# Patient Record
Sex: Female | Born: 1970 | Race: Black or African American | Hispanic: No | Marital: Single | Smoking: Current every day smoker
Health system: Southern US, Community
[De-identification: ages and names within clinical notes are randomized; demographics above are authoritative.]

## PROBLEM LIST (undated history)

## (undated) DIAGNOSIS — L509 Urticaria, unspecified: Secondary | ICD-10-CM

## (undated) DIAGNOSIS — E039 Hypothyroidism, unspecified: Secondary | ICD-10-CM

## (undated) DIAGNOSIS — D649 Anemia, unspecified: Secondary | ICD-10-CM

## (undated) DIAGNOSIS — F17200 Nicotine dependence, unspecified, uncomplicated: Secondary | ICD-10-CM

## (undated) DIAGNOSIS — A64 Unspecified sexually transmitted disease: Secondary | ICD-10-CM

## (undated) DIAGNOSIS — R87619 Unspecified abnormal cytological findings in specimens from cervix uteri: Secondary | ICD-10-CM

## (undated) HISTORY — DX: Anemia, unspecified: D64.9

## (undated) HISTORY — DX: Urticaria, unspecified: L50.9

## (undated) HISTORY — DX: Unspecified abnormal cytological findings in specimens from cervix uteri: R87.619

## (undated) HISTORY — DX: Hypothyroidism, unspecified: E03.9

## (undated) HISTORY — PX: COLPOSCOPY: SHX161

## (undated) HISTORY — DX: Nicotine dependence, unspecified, uncomplicated: F17.200

## (undated) HISTORY — DX: Unspecified sexually transmitted disease: A64

## (undated) HISTORY — PX: TUBAL LIGATION: SHX77

## (undated) HISTORY — PX: BREAST BIOPSY: SHX20

---

## 2020-02-22 ENCOUNTER — Encounter (HOSPITAL_COMMUNITY): Payer: Self-pay

## 2020-02-22 ENCOUNTER — Other Ambulatory Visit: Payer: Self-pay

## 2020-02-22 ENCOUNTER — Emergency Department (HOSPITAL_COMMUNITY): Payer: Commercial Managed Care - PPO

## 2020-02-22 DIAGNOSIS — R079 Chest pain, unspecified: Secondary | ICD-10-CM | POA: Diagnosis not present

## 2020-02-22 DIAGNOSIS — M25519 Pain in unspecified shoulder: Secondary | ICD-10-CM | POA: Insufficient documentation

## 2020-02-22 DIAGNOSIS — M542 Cervicalgia: Secondary | ICD-10-CM | POA: Diagnosis not present

## 2020-02-22 DIAGNOSIS — Z5321 Procedure and treatment not carried out due to patient leaving prior to being seen by health care provider: Secondary | ICD-10-CM | POA: Insufficient documentation

## 2020-02-22 NOTE — ED Notes (Signed)
Pt ambulatory to RR w/out assistance. 

## 2020-02-22 NOTE — ED Triage Notes (Signed)
She c/o sudden onset of pain at right upper chest/shoulder area about an hour p.t.a. whilst shopping. Her skin is normal, warm and dry and she is breathing normally. Screening EKG performed at triage.

## 2020-02-23 ENCOUNTER — Emergency Department (HOSPITAL_COMMUNITY)
Admission: EM | Admit: 2020-02-23 | Discharge: 2020-02-23 | Disposition: A | Discharge status: Home / Self Care | Payer: Commercial Managed Care - PPO | Attending: Emergency Medicine | Admitting: Emergency Medicine

## 2020-02-23 ENCOUNTER — Encounter (HOSPITAL_COMMUNITY): Payer: Self-pay

## 2020-02-23 ENCOUNTER — Other Ambulatory Visit: Payer: Self-pay

## 2020-02-23 ENCOUNTER — Emergency Department (HOSPITAL_COMMUNITY): Payer: Commercial Managed Care - PPO

## 2020-02-23 ENCOUNTER — Emergency Department (HOSPITAL_COMMUNITY)
Admission: EM | Admit: 2020-02-23 | Discharge: 2020-02-23 | Disposition: A | Discharge status: Home / Self Care | Payer: Commercial Managed Care - PPO | Source: Home / Self Care | Attending: Emergency Medicine | Admitting: Emergency Medicine

## 2020-02-23 DIAGNOSIS — R079 Chest pain, unspecified: Secondary | ICD-10-CM

## 2020-02-23 DIAGNOSIS — R0789 Other chest pain: Secondary | ICD-10-CM | POA: Insufficient documentation

## 2020-02-23 DIAGNOSIS — F17200 Nicotine dependence, unspecified, uncomplicated: Secondary | ICD-10-CM | POA: Insufficient documentation

## 2020-02-23 DIAGNOSIS — R0602 Shortness of breath: Secondary | ICD-10-CM | POA: Insufficient documentation

## 2020-02-23 DIAGNOSIS — M542 Cervicalgia: Secondary | ICD-10-CM | POA: Insufficient documentation

## 2020-02-23 LAB — BASIC METABOLIC PANEL
Anion gap: 9 (ref 5–15)
BUN: 9 mg/dL (ref 6–20)
CO2: 29 mmol/L (ref 22–32)
Calcium: 8.6 mg/dL — ABNORMAL LOW (ref 8.9–10.3)
Chloride: 101 mmol/L (ref 98–111)
Creatinine, Ser: 0.78 mg/dL (ref 0.44–1.00)
GFR calc Af Amer: >60 mL/min (ref >60)
GFR calc non Af Amer: >60 mL/min (ref >60)
Glucose, Bld: 81 mg/dL (ref 70–99)
Potassium: 3.4 mmol/L — ABNORMAL LOW (ref 3.5–5.1)
Sodium: 139 mmol/L (ref 135–145)

## 2020-02-23 LAB — CBC
HCT: 42.8 % (ref 36.0–46.0)
Hemoglobin: 13.8 g/dL (ref 12.0–15.0)
MCH: 28.3 pg (ref 26.0–34.0)
MCHC: 32.2 g/dL (ref 30.0–36.0)
MCV: 87.9 fL (ref 80.0–100.0)
Platelets: 255 10*3/uL (ref 150–400)
RBC: 4.87 MIL/uL (ref 3.87–5.11)
RDW: 14.3 % (ref 11.5–15.5)
WBC: 5.5 10*3/uL (ref 4.0–10.5)
nRBC: 0 % (ref 0.0–0.2)

## 2020-02-23 LAB — TROPONIN I (HIGH SENSITIVITY)
Troponin I (High Sensitivity): 5 ng/L (ref <18)
Troponin I (High Sensitivity): 5 ng/L (ref <18)

## 2020-02-23 LAB — D-DIMER, QUANTITATIVE: D-Dimer, Quant: <0.27 ug/mL-FEU (ref 0.00–0.50)

## 2020-02-23 MED ORDER — KETOROLAC TROMETHAMINE 30 MG/ML IJ SOLN
30.0000 mg | Freq: Once | INTRAMUSCULAR | Status: AC
  Administered 2020-02-23: 30 mg via INTRAVENOUS
  Filled 2020-02-23: qty 1

## 2020-02-23 MED ORDER — SODIUM CHLORIDE 0.9% FLUSH
3.0000 mL | Freq: Once | INTRAVENOUS | Status: AC
  Administered 2020-02-23: 3 mL via INTRAVENOUS

## 2020-02-23 NOTE — ED Notes (Signed)
Final call for pt with no response.  °

## 2020-02-23 NOTE — ED Provider Notes (Signed)
[facility]-EMERGENCY DEPT Provider Note   CSN: [id number] Arrival date & time: 02/23/20  1049     History Chief Complaint  Patient presents with  . Chest Pain    Lemon Whitacre is a 49 y.o. female.  She has no significant past medical history.  She is a smoker.  Complaining of left-sided chest and neck pain that started yesterday at rest.  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  Worse with bending twisting turning taking a deep breath.  Says she feels short of breath at times.  No prior history of cardiac disease.  She said she woke up drenched in sweat this morning.  No dizziness lightheadedness.  Denies any cocaine.  The history is provided by the patient.  Chest Pain Pain location:  L chest Pain quality: stabbing   Pain radiates to:  Neck Pain severity:  Moderate Onset quality:  Gradual Duration:  2 days Timing:  Constant Progression:  Unchanged Chronicity:  New Context: at rest   Relieved by:  None tried Worsened by:  Certain positions, deep breathing and movement Ineffective treatments:  None tried Associated symptoms: diaphoresis and shortness of breath   Associated symptoms: no abdominal pain, no altered mental status, no back pain, no cough, no dysphagia, no fever, no headache, no lower extremity edema, no nausea, no numbness, no vomiting and no weakness   Risk factors: smoking     HPI: A 49 year old patient presents for evaluation of chest pain. Initial onset of pain was more than 6 hours ago. The patient's chest pain is sharp and is not worse with exertion. The patient reports some diaphoresis. The patient's chest pain is middle- or left-sided, is not well-localized, is not described as heaviness/pressure/tightness and does radiate to the arms/jaw/neck. The patient does not complain of nausea. The patient has smoked in the past 90 days. The patient has no history of stroke, has no history of peripheral artery disease, denies  any history of treated diabetes, has no relevant family history of coronary artery disease (first degree relative at less than age 74), is not hypertensive, has no history of hypercholesterolemia and does not have an elevated BMI (>=30).   History reviewed. No pertinent past medical history.  There are no problems to display for this patient.   History reviewed. No pertinent surgical history.   OB History   No obstetric history on file.     History reviewed. No pertinent family history.  Social History   Tobacco Use  . Smoking status: Current Every Day Smoker  . Smokeless tobacco: Never Used  Substance Use Topics  . Alcohol use: Yes  . Drug use: Not on file    Home Medications Prior to Admission medications   Not on File    Allergies    Patient has no known allergies.  Review of Systems   Review of Systems  Constitutional: Positive for diaphoresis. Negative for fever.  HENT: Negative for sore throat and trouble swallowing.   Eyes: Negative for visual disturbance.  Respiratory: Positive for shortness of breath. Negative for cough.   Cardiovascular: Positive for chest pain.  Gastrointestinal: Negative for abdominal pain, nausea and vomiting.  Genitourinary: Negative for dysuria.  Musculoskeletal: Positive for neck pain. Negative for back pain.  Skin: Negative for rash.  Neurological: Negative for weakness, numbness and headaches.    Physical Exam Updated Vital Signs BP (!) 130/83   Pulse 80   Temp (!) 97.5 F (36.4 C) (  Oral)   Resp 21   Ht 5\' 6"  (1.676 m)   Wt 59 kg   LMP  (LMP Unknown)   SpO2 100%   BMI 20.98 kg/m   Physical Exam Vitals and nursing note reviewed.  Constitutional:      General: She is not in acute distress.    Appearance: She is well-developed.  HENT:     Head: Normocephalic and atraumatic.  Eyes:     Conjunctiva/sclera: Conjunctivae normal.  Cardiovascular:     Rate and Rhythm: Normal rate and regular rhythm.     Heart sounds:  Normal heart sounds. No murmur heard.   Pulmonary:     Effort: Pulmonary effort is normal. No respiratory distress.     Breath sounds: Normal breath sounds.  Abdominal:     Palpations: Abdomen is soft.     Tenderness: There is no abdominal tenderness.  Musculoskeletal:        General: Normal range of motion.     Cervical back: Neck supple.     Right lower leg: No tenderness.     Left lower leg: No tenderness.  Skin:    General: Skin is warm and dry.     Capillary Refill: Capillary refill takes less than 2 seconds.  Neurological:     General: No focal deficit present.     Mental Status: She is alert.     ED Results / Procedures / Treatments   Labs (all labs ordered are listed, but only abnormal results are displayed) Labs Reviewed  BASIC METABOLIC PANEL - Abnormal; Notable for the following components:      Result Value   Potassium 3.4 (*)    Calcium 8.6 (*)    All other components within normal limits  CBC  D-DIMER, QUANTITATIVE (NOT AT [facility])  TROPONIN I (HIGH SENSITIVITY)  TROPONIN I (HIGH SENSITIVITY)    EKG EKG Interpretation  Date/Time:  Sunday February 23 2020 11:07:38 EDT Ventricular Rate:  84 PR Interval:    QRS Duration: 79 QT Interval:  384 QTC Calculation: 454 R Axis:   62 Text Interpretation: Sinus rhythm Low voltage, precordial leads Baseline wander in lead(s) II aVF 12 Lead; Mason-Likar since last tracing no significant change Confirmed by [id number] [phone]) on 02/23/2020 11:26:26 AM   Radiology DG Chest 2 View  Result Date: 02/23/2020 CLINICAL DATA:  Left upper chest pain with inspiration since yesterday. EXAM: CHEST - 2 VIEW COMPARISON:  02/22/2020 FINDINGS: The heart size and mediastinal contours are within normal limits. Both lungs are clear. The visualized skeletal structures are unremarkable. IMPRESSION: No active cardiopulmonary disease. Electronically Signed   By: 02/24/2020 M.D.   On: 02/23/2020 11:39   DG Chest 2 View  Result Date:  02/22/2020 CLINICAL DATA:  Sudden onset right upper chest pain, right shoulder pain EXAM: CHEST - 2 VIEW COMPARISON:  None. FINDINGS: Frontal and lateral views of the chest demonstrate an unremarkable cardiac silhouette. No airspace disease, effusion, or pneumothorax. No acute bony abnormalities. IMPRESSION: 1. No acute intrathoracic process. Electronically Signed   By: 02/24/2020 M.D.   On: 02/22/2020 20:21    Procedures Procedures (including critical care time)  Medications Ordered in ED Medications  sodium chloride flush (NS) 0.9 % injection 3 mL (has no administration in time range)  ketorolac (TORADOL) 30 MG/ML injection 30 mg (has no administration in time range)    ED Course  I have reviewed the triage vital signs and the nursing notes.  Pertinent labs & imaging  results that were available during my care of the patient were reviewed by me and considered in my medical decision making (see chart for details).  Clinical Course as of Feb 24 1119  Sun Feb 23, 2020  1415 Patient is not PERC negative due to her age but she is Wells negative.   [MB]  1559 Patient's delta troponin unchanged a D-dimer negative.   [MB]    Clinical Course User Index [MB] Terrilee Files, MD   MDM Rules/Calculators/A&P HEAR Score: 4                       This patient complains of sharp left-sided chest and neck pain; this involves an extensive number of treatment Options and is a complaint that carries with it a high risk of complications and Morbidity. The differential includes ACS, PE, musculoskeletal, GERD, pneumothorax, vascular, pleurisy  I ordered, reviewed and interpreted labs, which included CBC with normal white count normal hemoglobin, chemistries normal other than a mildly low potassium of 3.4 and calcium of 8.6, delta troponin negative, D-dimer negative I ordered medication Toradol with minimal change in patient's symptoms I ordered imaging studies which included chest x-ray and I  independently    visualized and interpreted imaging which showed no pneumothorax no infiltrates Previous records obtained and reviewed in epic, no recent visits for same  After the interventions stated above, I reevaluated the patient and found patient remains hemodynamically stable satting 100% on room air.  Discussed with her possible causes of her symptoms and recommended symptomatic treatment such as Tylenol ice heat to affected area.  Return instructions discussed   Final Clinical Impression(s) / ED Diagnoses Final diagnoses:  Nonspecific chest pain    Rx / DC Orders ED Discharge Orders    None       Terrilee Files, MD 02/24/20 1122

## 2020-02-23 NOTE — Discharge Instructions (Signed)
You were seen in the emergency department for left-sided chest and neck pain.  You had blood work EKG and a chest x-ray that did not show any serious findings.  The symptoms may be muscular.  You can try ibuprofen 3 times a day with some food in your stomach.  Try some ice or heat to the affected area.  Follow-up with your doctor and return to the emergency department if any worsening or concerning symptoms.

## 2020-02-23 NOTE — ED Triage Notes (Signed)
No answer from pt when called x 1.  

## 2020-02-23 NOTE — ED Triage Notes (Signed)
Pt presents with c/o chest pain that started yesterday. Pt was here last night for same and left because of the wait time. Pt reports the pain is in her upper left chest. Pt denies any associated shortness of breath.

## 2020-02-23 NOTE — ED Notes (Signed)
No answer from pt when called for room x 2.  

## 2020-10-14 ENCOUNTER — Other Ambulatory Visit: Payer: Self-pay

## 2020-10-14 ENCOUNTER — Emergency Department (HOSPITAL_COMMUNITY)
Admission: EM | Admit: 2020-10-14 | Discharge: 2020-10-14 | Disposition: A | Discharge status: Home / Self Care | Payer: Commercial Managed Care - PPO | Attending: Emergency Medicine | Admitting: Emergency Medicine

## 2020-10-14 DIAGNOSIS — I951 Orthostatic hypotension: Secondary | ICD-10-CM | POA: Diagnosis not present

## 2020-10-14 DIAGNOSIS — F17200 Nicotine dependence, unspecified, uncomplicated: Secondary | ICD-10-CM | POA: Diagnosis not present

## 2020-10-14 DIAGNOSIS — E039 Hypothyroidism, unspecified: Secondary | ICD-10-CM | POA: Insufficient documentation

## 2020-10-14 DIAGNOSIS — R42 Dizziness and giddiness: Secondary | ICD-10-CM | POA: Diagnosis present

## 2020-10-14 LAB — URINALYSIS, ROUTINE W REFLEX MICROSCOPIC
Bilirubin Urine: NEGATIVE
Glucose, UA: NEGATIVE mg/dL
Ketones, ur: NEGATIVE mg/dL
Leukocytes,Ua: NEGATIVE
Nitrite: NEGATIVE
Protein, ur: NEGATIVE mg/dL
Specific Gravity, Urine: 1.003 — ABNORMAL LOW (ref 1.005–1.030)
pH: 7 (ref 5.0–8.0)

## 2020-10-14 LAB — COMPREHENSIVE METABOLIC PANEL
ALT: 12 U/L (ref 0–44)
AST: 16 U/L (ref 15–41)
Albumin: 3.8 g/dL (ref 3.5–5.0)
Alkaline Phosphatase: 75 U/L (ref 38–126)
Anion gap: 7 (ref 5–15)
BUN: 11 mg/dL (ref 6–20)
CO2: 27 mmol/L (ref 22–32)
Calcium: 8.6 mg/dL — ABNORMAL LOW (ref 8.9–10.3)
Chloride: 104 mmol/L (ref 98–111)
Creatinine, Ser: 0.8 mg/dL (ref 0.44–1.00)
GFR, Estimated: >60 mL/min (ref >60)
Glucose, Bld: 85 mg/dL (ref 70–99)
Potassium: 3.4 mmol/L — ABNORMAL LOW (ref 3.5–5.1)
Sodium: 138 mmol/L (ref 135–145)
Total Bilirubin: 0.6 mg/dL (ref 0.3–1.2)
Total Protein: 6.9 g/dL (ref 6.5–8.1)

## 2020-10-14 LAB — CBC WITH DIFFERENTIAL/PLATELET
Abs Immature Granulocytes: 0.03 10*3/uL (ref 0.00–0.07)
Basophils Absolute: 0 10*3/uL (ref 0.0–0.1)
Basophils Relative: 1 %
Eosinophils Absolute: 0.1 10*3/uL (ref 0.0–0.5)
Eosinophils Relative: 2 %
HCT: 37.6 % (ref 36.0–46.0)
Hemoglobin: 12 g/dL (ref 12.0–15.0)
Immature Granulocytes: 1 %
Lymphocytes Relative: 33 %
Lymphs Abs: 2.1 10*3/uL (ref 0.7–4.0)
MCH: 28.4 pg (ref 26.0–34.0)
MCHC: 31.9 g/dL (ref 30.0–36.0)
MCV: 88.9 fL (ref 80.0–100.0)
Monocytes Absolute: 0.5 10*3/uL (ref 0.1–1.0)
Monocytes Relative: 8 %
Neutro Abs: 3.6 10*3/uL (ref 1.7–7.7)
Neutrophils Relative %: 55 %
Platelets: 234 10*3/uL (ref 150–400)
RBC: 4.23 MIL/uL (ref 3.87–5.11)
RDW: 14.7 % (ref 11.5–15.5)
WBC: 6.4 10*3/uL (ref 4.0–10.5)
nRBC: 0 % (ref 0.0–0.2)

## 2020-10-14 LAB — TSH: TSH: 105.354 u[IU]/mL — ABNORMAL HIGH (ref 0.350–4.500)

## 2020-10-14 MED ORDER — LEVOTHYROXINE SODIUM 50 MCG PO TABS
50.0000 ug | ORAL_TABLET | Freq: Every day | ORAL | 0 refills | Status: DC
Start: 2020-10-14 — End: 2020-11-11

## 2020-10-14 MED ORDER — ACETAMINOPHEN 500 MG PO TABS
1000.0000 mg | ORAL_TABLET | Freq: Once | ORAL | Status: AC
  Administered 2020-10-14: 1000 mg via ORAL
  Filled 2020-10-14: qty 2

## 2020-10-14 MED ORDER — SODIUM CHLORIDE 0.9 % IV BOLUS
1000.0000 mL | Freq: Once | INTRAVENOUS | Status: AC
  Administered 2020-10-14: 1000 mL via INTRAVENOUS

## 2020-10-14 NOTE — ED Provider Notes (Signed)
[facility]-EMERGENCY DEPT Provider Note   CSN: [id number] Arrival date & time: 10/14/20  1012     History Chief Complaint  Patient presents with  . Dizziness    Nichole Beck is a 50 y.o. female.  Pt presents to the ED today with dizziness.  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  Her shift starts around 0600.  She said she went to the bus depot around 9 and was ok.  Around 9:30 when she was going back to the bus, she became dizzy.  She was orthostatic per EMS and was given 400 cc NS en route.  She is feeling better after fluids.  Pt denies any other sx.  She said she did not eat or drink anything this morning.  Pt does have a hx of hypothyroidism, but has not been on any meds in about 2 years.  She did not have insurance and felt like the medicine was not helping, so she stopped taking it.  She does not think her thyroid has been checked since Covid began.        No past medical history on file.  There are no problems to display for this patient.   No past surgical history on file.   OB History   No obstetric history on file.     No family history on file.  Social History   Tobacco Use  . Smoking status: Current Every Day Smoker  . Smokeless tobacco: Never Used  Substance Use Topics  . Alcohol use: Yes    Home Medications Prior to Admission medications   Medication Sig Start Date End Date Taking? Authorizing Provider  levothyroxine (SYNTHROID) 50 MCG tablet Take 1 tablet (50 mcg total) by mouth daily before breakfast. 10/14/20  Yes Jacalyn Lefevre, MD    Allergies    Patient has no known allergies.  Review of Systems   Review of Systems  Neurological: Positive for dizziness and weakness.  All other systems reviewed and are negative.   Physical Exam Updated Vital Signs BP 118/85   Pulse 64   Temp 98.2 F (36.8 C) (Oral)   Resp 13   LMP  (LMP Unknown)   SpO2 100%   Physical Exam Vitals and nursing note reviewed.   Constitutional:      Appearance: Normal appearance.  HENT:     Head: Normocephalic and atraumatic.     Right Ear: External ear normal.     Left Ear: External ear normal.     Nose: Nose normal.     Mouth/Throat:     Mouth: Mucous membranes are moist.     Pharynx: Oropharynx is clear.  Eyes:     Extraocular Movements: Extraocular movements intact.     Conjunctiva/sclera: Conjunctivae normal.     Pupils: Pupils are equal, round, and reactive to light.  Cardiovascular:     Rate and Rhythm: Normal rate and regular rhythm.     Pulses: Normal pulses.     Heart sounds: Normal heart sounds.  Pulmonary:     Effort: Pulmonary effort is normal.     Breath sounds: Normal breath sounds.  Abdominal:     General: Abdomen is flat. Bowel sounds are normal.     Palpations: Abdomen is soft.  Musculoskeletal:        General: Normal range of motion.     Cervical back: Normal range of motion and neck supple.  Skin:    General: Skin is warm.  Capillary Refill: Capillary refill takes less than 2 seconds.  Neurological:     General: No focal deficit present.     Mental Status: She is alert and oriented to person, place, and time.  Psychiatric:        Mood and Affect: Mood normal.        Behavior: Behavior normal.     ED Results / Procedures / Treatments   Labs (all labs ordered are listed, but only abnormal results are displayed) Labs Reviewed  COMPREHENSIVE METABOLIC PANEL - Abnormal; Notable for the following components:      Result Value   Potassium 3.4 (*)    Calcium 8.6 (*)    All other components within normal limits  URINALYSIS, ROUTINE W REFLEX MICROSCOPIC - Abnormal; Notable for the following components:   Color, Urine COLORLESS (*)    Specific Gravity, Urine 1.003 (*)    Hgb urine dipstick SMALL (*)    Bacteria, UA RARE (*)    All other components within normal limits  TSH - Abnormal; Notable for the following components:   TSH 105.354 (*)    All other components within  normal limits  CBC WITH DIFFERENTIAL/PLATELET    EKG EKG Interpretation  Date/Time:  Wednesday October 14 2020 10:50:59 EDT Ventricular Rate:  64 PR Interval:    QRS Duration: 82 QT Interval:  418 QTC Calculation: 432 R Axis:   57 Text Interpretation: Sinus rhythm No significant change since last tracing Confirmed by Jacalyn Lefevre [phone]) on 10/14/2020 11:26:06 AM   Radiology No results found.  Procedures Procedures   Medications Ordered in ED Medications  acetaminophen (TYLENOL) tablet 1,000 mg (has no administration in time range)  sodium chloride 0.9 % bolus 1,000 mL (1,000 mLs Intravenous New Bag/Given 10/14/20 1114)    ED Course  I have reviewed the triage vital signs and the nursing notes.  Pertinent labs & imaging results that were available during my care of the patient were reviewed by me and considered in my medical decision making (see chart for details).    MDM Rules/Calculators/A&P                          Pt is feeling much better.  TSH is 105.  Pt is willing to start synthroid at a low dose.  I will start her at 50 mcg.  Pt does not have a pcp in [place].  She only has one in [place] that she has not seen in awhile.  She is given the number of the [facility]  She is to return if worse.  Establish care with pcp.  Final Clinical Impression(s) / ED Diagnoses Final diagnoses:  Orthostatic syncope  Hypothyroidism, unspecified type    Rx / DC Orders ED Discharge Orders         Ordered    levothyroxine (SYNTHROID) 50 MCG tablet  Daily before breakfast        10/14/20 1311           Jacalyn Lefevre, MD 10/14/20 1313

## 2020-10-14 NOTE — ED Triage Notes (Signed)
BIBA  Per EMS: Pt woke up this morning with L side neck being sore. Pt then developed acute onset of  dizziness and blurry vision. Pt states not eating or drinking much in the last few days.  EMS noted Orthostatic positive changes  18 L forearm  400cc fluids 140/84 80 HR  99%

## 2020-11-10 DIAGNOSIS — F17200 Nicotine dependence, unspecified, uncomplicated: Secondary | ICD-10-CM | POA: Diagnosis not present

## 2020-11-10 DIAGNOSIS — M542 Cervicalgia: Secondary | ICD-10-CM | POA: Diagnosis present

## 2020-11-10 DIAGNOSIS — R070 Pain in throat: Secondary | ICD-10-CM | POA: Diagnosis not present

## 2020-11-10 DIAGNOSIS — Z79899 Other long term (current) drug therapy: Secondary | ICD-10-CM | POA: Insufficient documentation

## 2020-11-10 DIAGNOSIS — R1310 Dysphagia, unspecified: Secondary | ICD-10-CM | POA: Insufficient documentation

## 2020-11-10 DIAGNOSIS — E039 Hypothyroidism, unspecified: Secondary | ICD-10-CM | POA: Insufficient documentation

## 2020-11-10 LAB — BASIC METABOLIC PANEL
Anion gap: 7 (ref 5–15)
BUN: 8 mg/dL (ref 6–20)
CO2: 26 mmol/L (ref 22–32)
Calcium: 9 mg/dL (ref 8.9–10.3)
Chloride: 106 mmol/L (ref 98–111)
Creatinine, Ser: 1 mg/dL (ref 0.44–1.00)
GFR, Estimated: >60 mL/min (ref >60)
Glucose, Bld: 91 mg/dL (ref 70–99)
Potassium: 3.9 mmol/L (ref 3.5–5.1)
Sodium: 139 mmol/L (ref 135–145)

## 2020-11-10 NOTE — ED Notes (Signed)
Pt informed of right for MSE, signature pad in room not working at this time.

## 2020-11-10 NOTE — ED Triage Notes (Addendum)
Pt c/o neck pain wrapping around to her throat x 2-3wks getting worse today. Also reports L sided chest pain starting around 9pm. States she was evaluated for same before for problems with her thyroid

## 2020-11-10 NOTE — ED Provider Notes (Signed)
MSE was initiated and I personally evaluated the patient and placed orders (if any) at  10:52 PM on November 10, 2020.  The patient appears stable so that the remainder of the MSE may be completed by another provider.  Nichole Beck planes of about 1 month of progressive, squeezing anterior neck pain.  She has endorsed some very occasional and mild left-sided chest pain that she describes as sharp.  However, her neck pain seems to be the predominant source of her problem.  She was seen in the ED on 10/14/2020 and noted to have hypothyroidism.  She was started on levothyroxine and has taken it, but she reports that she is feeling worse.  She came in tonight because she is scared of going to bed because she is having difficulty swallowing.   [name], MD 11/10/20 2253

## 2020-11-11 ENCOUNTER — Emergency Department (HOSPITAL_COMMUNITY): Payer: Commercial Managed Care - PPO

## 2020-11-11 ENCOUNTER — Encounter (HOSPITAL_COMMUNITY): Payer: Self-pay

## 2020-11-11 ENCOUNTER — Emergency Department (HOSPITAL_COMMUNITY)
Admission: EM | Admit: 2020-11-11 | Discharge: 2020-11-11 | Disposition: A | Discharge status: Home / Self Care | Payer: Commercial Managed Care - PPO | Attending: Emergency Medicine | Admitting: Emergency Medicine

## 2020-11-11 DIAGNOSIS — E039 Hypothyroidism, unspecified: Secondary | ICD-10-CM

## 2020-11-11 LAB — CBC WITH DIFFERENTIAL/PLATELET
Abs Immature Granulocytes: 0.06 10*3/uL (ref 0.00–0.07)
Basophils Absolute: 0.1 10*3/uL (ref 0.0–0.1)
Basophils Relative: 1 %
Eosinophils Absolute: 0.2 10*3/uL (ref 0.0–0.5)
Eosinophils Relative: 2 %
HCT: 40.2 % (ref 36.0–46.0)
Hemoglobin: 13 g/dL (ref 12.0–15.0)
Immature Granulocytes: 1 %
Lymphocytes Relative: 40 %
Lymphs Abs: 2.7 10*3/uL (ref 0.7–4.0)
MCH: 28.3 pg (ref 26.0–34.0)
MCHC: 32.3 g/dL (ref 30.0–36.0)
MCV: 87.6 fL (ref 80.0–100.0)
Monocytes Absolute: 0.5 10*3/uL (ref 0.1–1.0)
Monocytes Relative: 7 %
Neutro Abs: 3.3 10*3/uL (ref 1.7–7.7)
Neutrophils Relative %: 49 %
Platelets: 294 10*3/uL (ref 150–400)
RBC: 4.59 MIL/uL (ref 3.87–5.11)
RDW: 14.5 % (ref 11.5–15.5)
WBC: 6.8 10*3/uL (ref 4.0–10.5)
nRBC: 0 % (ref 0.0–0.2)

## 2020-11-11 LAB — TSH: TSH: 70.767 u[IU]/mL — ABNORMAL HIGH (ref 0.350–4.500)

## 2020-11-11 MED ORDER — LEVOTHYROXINE SODIUM 50 MCG PO TABS: 50.0000 ug | ORAL_TABLET | Freq: Every day | ORAL | 0 refills | Status: DC

## 2020-11-11 MED ORDER — IOHEXOL 300 MG/ML  SOLN
75.0000 mL | Freq: Once | INTRAMUSCULAR | Status: AC | PRN
  Administered 2020-11-11: 75 mL via INTRAVENOUS

## 2020-11-11 NOTE — ED Provider Notes (Signed)
[facility]-EMERGENCY DEPT Provider Note   CSN: [id number] Arrival date & time: 11/10/20  2213     History Chief Complaint  Patient presents with  . Neck Pain    Nichole Beck is a 50 y.o. female.  The history is provided by the patient and medical records.  Neck Pain   50 y.o. F here with throat pain.  States this has been ongoing for a few weeks now.  States it is along the anterior neck, described as squeezing.  She reports being seen in March in ED and told she had thyroid problems.  She states she has been taking as directed but actually feels worse.  She reports she feels like she is having difficulty swallowing and was scared to go to sleep.  She reports it is a sensation "like I am going to swallow my tongue".  She has been able to eat/drink today without issue.  No fevers or other infectious symptoms.  She denies feelings of anxiety.  History reviewed. No pertinent past medical history.  There are no problems to display for this patient.   No past surgical history on file.   OB History   No obstetric history on file.     No family history on file.  Social History   Tobacco Use  . Smoking status: Current Every Day Smoker  . Smokeless tobacco: Never Used  Substance Use Topics  . Alcohol use: Yes    Home Medications Prior to Admission medications   Medication Sig Start Date End Date Taking? Authorizing Provider  levothyroxine (SYNTHROID) 50 MCG tablet Take 1 tablet (50 mcg total) by mouth daily before breakfast. 10/14/20   Jacalyn Lefevre, MD    Allergies    Patient has no known allergies.  Review of Systems   Review of Systems  Musculoskeletal: Positive for neck pain.  All other systems reviewed and are negative.   Physical Exam Updated Vital Signs BP (!) 124/91   Pulse 66   Temp 97.9 F (36.6 C) (Oral)   Resp 18   Ht 5\' 7"  (1.702 m)   Wt 61.2 kg   LMP  (LMP Unknown)   SpO2 98%   BMI 21.14 kg/m   Physical Exam Vitals  and nursing note reviewed.  Constitutional:      Appearance: She is well-developed.  HENT:     Head: Normocephalic and atraumatic.  Eyes:     Conjunctiva/sclera: Conjunctivae normal.     Pupils: Pupils are equal, round, and reactive to light.  Neck:     Comments: No discrete mass noted, thyroid palpable and feels normal in size, handling secretions well, phonation normal, no stridor Cardiovascular:     Rate and Rhythm: Normal rate and regular rhythm.     Heart sounds: Normal heart sounds.  Pulmonary:     Effort: Pulmonary effort is normal. No respiratory distress.     Breath sounds: Normal breath sounds. No rhonchi.  Abdominal:     General: Bowel sounds are normal.     Palpations: Abdomen is soft.     Tenderness: There is no abdominal tenderness. There is no rebound.  Musculoskeletal:        General: Normal range of motion.     Cervical back: Normal range of motion.  Skin:    General: Skin is warm and dry.  Neurological:     Mental Status: She is alert and oriented to person, place, and time.     ED Results / Procedures / Treatments  Labs (all labs ordered are listed, but only abnormal results are displayed) Labs Reviewed  TSH - Abnormal; Notable for the following components:      Result Value   TSH 70.767 (*)    All other components within normal limits  CBC WITH DIFFERENTIAL/PLATELET  BASIC METABOLIC PANEL    EKG None  Radiology CT Soft Tissue Neck W Contrast  Result Date: 11/11/2020 CLINICAL DATA:  Initial evaluation for 1 month history of progressive squeezing anterior neck pain. EXAM: CT NECK WITH CONTRAST TECHNIQUE: Multidetector CT imaging of the neck was performed using the standard protocol following the bolus administration of intravenous contrast. CONTRAST:  1mL OMNIPAQUE IOHEXOL 300 MG/ML  SOLN COMPARISON:  None available. FINDINGS: Pharynx and larynx: Oral cavity within normal limits. No acute abnormality about the dentition. Palatine tonsils fairly  symmetric and within normal limits. Parapharyngeal fat maintained. Remainder of the oropharynx and nasopharynx within normal limits. No retropharyngeal collection or swelling. Epiglottis normal. Remainder of the hypopharynx and supraglottic larynx within normal limits. Glottis normal. Subglottic airway patent clear. Salivary glands: Salivary glands including the parotid and submandibular glands are within normal limits. Thyroid: Normal. Lymph nodes: No enlarged or pathologic adenopathy within the neck. Vascular: Normal intravascular enhancement seen throughout the neck. Limited intracranial: Unremarkable. Visualized orbits: Unremarkable. Mastoids and visualized paranasal sinuses: Small left maxillary sinus retention cyst. Mild pneumatized secretions noted within the right sphenoid sinus, with additional scattered mild mucosal thickening within the visualized ethmoidal air cells. Paranasal sinuses are otherwise clear. Visualized mastoids and middle ear cavities are well pneumatized and free of fluid. Skeleton: No visible acute osseous finding. No discrete or worrisome osseous lesions. Upper chest: Visualized upper chest demonstrates no acute finding. Partially visualized lungs are clear. Other: None. IMPRESSION: Negative CT of the neck. No acute inflammatory changes or other abnormality identified. Electronically Signed   By: Rise Mu M.D.   On: 11/11/2020 04:01    Procedures Procedures   Medications Ordered in ED Medications  iohexol (OMNIPAQUE) 300 MG/ML solution 75 mL (75 mLs Intravenous Contrast Given 11/11/20 6712)    ED Course  I have reviewed the triage vital signs and the nursing notes.  Pertinent labs & imaging results that were available during my care of the patient were reviewed by me and considered in my medical decision making (see chart for details).    MDM Rules/Calculators/A&P  50 year old female presenting to the ED with sensation of anterior throat pain and difficulty  swallowing.  She was seen last month and diagnosed with hypothyroidism.  States now she feels like she is having trouble swallowing like "I am going to swallow my tongue".  She denies feeling anxious.  She has been compliant with Synthroid that she was started on.  Labs today with improved TSH (100 3/16 and now 70).  CT without acute findings, specifically no thyroid masses or nodules.  Patient was reassured.  She still has yet to establish care with primary care doctor.  I have given her office information for clinics near her house and sent extra 2 weeks of synthroid prescription to pharmacy.  I have strongly encouraged her to call this morning and to get follow-up arranged as this is not a condition that can be routinely managed from the emergency department.  She was given copies of her labs for physician review.  She may return here for any new or acute changes.  Final Clinical Impression(s) / ED Diagnoses Final diagnoses:  Hypothyroidism, unspecified type    Rx / DC  Orders ED Discharge Orders         Ordered    levothyroxine (SYNTHROID) 50 MCG tablet  Daily before breakfast        11/11/20 0417           Garlon Hatchet, PA-C 11/11/20 0423    Zadie Rhine, MD 11/11/20 [phone]

## 2020-11-11 NOTE — Discharge Instructions (Signed)
Continue your synthroid. Follow-up with a primary care doctor.  Try and call later today, this is very important to keep track of your thyroid numbers. Return to the ED for new or worsening symptoms.

## 2020-11-11 NOTE — ED Notes (Signed)
Patient transported to CT 

## 2020-11-19 ENCOUNTER — Other Ambulatory Visit: Payer: Self-pay

## 2020-11-20 ENCOUNTER — Encounter: Payer: Self-pay | Admitting: Internal Medicine

## 2020-11-20 ENCOUNTER — Ambulatory Visit (INDEPENDENT_AMBULATORY_CARE_PROVIDER_SITE_OTHER): Payer: Commercial Managed Care - PPO | Admitting: Internal Medicine

## 2020-11-20 VITALS — BP 130/84 | HR 76 | Temp 98.4°F | Ht 67.0 in | Wt 150.5 lb

## 2020-11-20 DIAGNOSIS — E039 Hypothyroidism, unspecified: Secondary | ICD-10-CM

## 2020-11-20 DIAGNOSIS — Z124 Encounter for screening for malignant neoplasm of cervix: Secondary | ICD-10-CM | POA: Diagnosis not present

## 2020-11-20 DIAGNOSIS — Z1231 Encounter for screening mammogram for malignant neoplasm of breast: Secondary | ICD-10-CM

## 2020-11-20 DIAGNOSIS — Z1211 Encounter for screening for malignant neoplasm of colon: Secondary | ICD-10-CM | POA: Diagnosis not present

## 2020-11-20 DIAGNOSIS — F17200 Nicotine dependence, unspecified, uncomplicated: Secondary | ICD-10-CM | POA: Insufficient documentation

## 2020-11-20 DIAGNOSIS — F17210 Nicotine dependence, cigarettes, uncomplicated: Secondary | ICD-10-CM

## 2020-11-20 MED ORDER — LEVOTHYROXINE SODIUM 50 MCG PO TABS
50.0000 ug | ORAL_TABLET | Freq: Every day | ORAL | 1 refills | Status: DC
Start: 2020-11-20 — End: 2021-01-08

## 2020-11-20 NOTE — Progress Notes (Signed)
New Patient Office Visit     This visit occurred during the SARS-CoV-2 public health emergency.  Safety protocols were in place, including screening questions prior to the visit, additional usage of staff PPE, and extensive cleaning of exam room while observing appropriate contact time as indicated for disinfecting solutions.    CC/Reason for Visit: Establish care, discuss chronic medical conditions Previous PCP: None Last Visit: Unknown  HPI: Nichole Beck is a 50 y.o. female who is coming in today for the above mentioned reasons. Past Medical History is significant for: Hypothyroidism that was diagnosed approximately 10 years ago.  3 years ago she lost her medical insurance and so quit taking her thyroid medication.  About a month ago she needed to visit the emergency department due to feeling like something was stuck in her throat.  CT scan of the neck and soft tissue that was negative for acute changes.  Her TSH was 100.  She was started on levothyroxine 50 mcg daily and referred to see me.  She has not had medical care in years.  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  She has 2 grown children, she is single.  She is an ongoing smoker of about half pack a day for over 40 years.  She does not drink other than occasionally, she has no known drug allergies and no past surgical history.  She does not know her family history well.  She believes both of her grandmothers had cancer but cannot articulate what type.   Past Medical/Surgical History: Past Medical History:  Diagnosis Date  . Hypothyroidism   . Nicotine dependence     History reviewed. No pertinent surgical history.  Social History:  reports that she has been smoking. She has never used smokeless tobacco. She reports current alcohol use. No history on file for drug use.  Allergies: No Known Allergies  Family History:  History reviewed. No pertinent family history.   Current Outpatient  Medications:  .  levothyroxine (SYNTHROID) 50 MCG tablet, Take 1 tablet (50 mcg total) by mouth daily before breakfast., Disp: 90 tablet, Rfl: 1  Review of Systems:  Constitutional: Denies fever, chills, diaphoresis, appetite change. HEENT: Denies photophobia, eye pain, redness, hearing loss, ear pain, congestion, sore throat, rhinorrhea, sneezing, mouth sores, neck pain, neck stiffness and tinnitus.   Respiratory: Denies SOB, DOE, cough, chest tightness,  and wheezing.   Cardiovascular: Denies chest pain, palpitations and leg swelling.  Gastrointestinal: Denies nausea, vomiting, abdominal pain, diarrhea, constipation, blood in stool and abdominal distention.  Genitourinary: Denies dysuria, urgency, frequency, hematuria, flank pain and difficulty urinating.  Endocrine: Denies: hot or cold intolerance, sweats, changes in hair or nails, polyuria, polydipsia. Musculoskeletal: Denies myalgias, back pain, joint swelling, arthralgias and gait problem.  Skin: Denies pallor, rash and wound.  Neurological: Denies dizziness, seizures, syncope, weakness, light-headedness, numbness and headaches.  Hematological: Denies adenopathy. Easy bruising, personal or family bleeding history  Psychiatric/Behavioral: Denies suicidal ideation, mood changes, confusion, nervousness, sleep disturbance and agitation    Physical Exam: Vitals:   11/20/20 1349  BP: 130/84  Pulse: 76  Temp: 98.4 F (36.9 C)  TempSrc: Oral  SpO2: 99%  Weight: 150 lb 8 oz (68.3 kg)  Height: 5\' 7"  (1.702 m)   Body mass index is 23.57 kg/m.  Constitutional: NAD, calm, comfortable Eyes: PERRL, lids and conjunctivae normal ENMT: Mucous membranes are moist.  Neck: normal, supple, no masses, significant neck fullness consistent with mild  thyromegaly. Respiratory: clear to auscultation bilaterally, no wheezing, no crackles. Normal respiratory effort. No accessory muscle use.  Cardiovascular: Regular rate and rhythm, no murmurs / rubs /  gallops. No extremity edema.  Neurologic: Grossly intact and nonfocal Psychiatric: Normal judgment and insight. Alert and oriented x 3. Normal mood.    Impression and Plan:  Screening for cervical cancer  - Plan: Ambulatory referral to Gynecology  Screening for malignant neoplasm of colon  - Plan: Ambulatory referral to Gastroenterology  Encounter for screening mammogram for malignant neoplasm of breast  - Plan: MM Digital Screening  Hypothyroidism, unspecified type  -Advised to continue 50 mcg of levothyroxine for now, she will return in 6 weeks for repeat TSH and if needed levothyroxine dose adjustment.  Cigarette nicotine dependence without complication -We have not had time to discuss this issue at length today.  Time spent: 45 minutes reviewing hospital records, interviewing patient, examining patient and discussing plan of care.    Patient Instructions  -Nice seeing you today!!  -Continue levothyroxine 50 mcg daily.  -Come back for labs in 6 weeks.  -Schedule follow up with me in 3 months for your physical. Please come in fasting that day.     [name], MD [facility]

## 2020-11-20 NOTE — Patient Instructions (Signed)
-  Nice seeing you today!!  -Continue levothyroxine 50 mcg daily.  -Come back for labs in 6 weeks.  -Schedule follow up with me in 3 months for your physical. Please come in fasting that day.

## 2020-12-08 ENCOUNTER — Ambulatory Visit: Payer: Commercial Managed Care - PPO | Admitting: Internal Medicine

## 2021-01-01 ENCOUNTER — Encounter: Payer: Self-pay | Admitting: Nurse Practitioner

## 2021-01-01 ENCOUNTER — Other Ambulatory Visit (HOSPITAL_COMMUNITY)
Admission: RE | Admit: 2021-01-01 | Discharge: 2021-01-01 | Disposition: A | Discharge status: Home / Self Care | Payer: Commercial Managed Care - PPO | Source: Ambulatory Visit | Attending: Nurse Practitioner | Admitting: Nurse Practitioner

## 2021-01-01 ENCOUNTER — Other Ambulatory Visit: Payer: Self-pay

## 2021-01-01 ENCOUNTER — Ambulatory Visit (INDEPENDENT_AMBULATORY_CARE_PROVIDER_SITE_OTHER): Payer: Commercial Managed Care - PPO | Admitting: Nurse Practitioner

## 2021-01-01 VITALS — BP 110/72 | HR 76 | Resp 16 | Ht 66.25 in | Wt 147.0 lb

## 2021-01-01 DIAGNOSIS — Z01419 Encounter for gynecological examination (general) (routine) without abnormal findings: Secondary | ICD-10-CM | POA: Diagnosis present

## 2021-01-01 NOTE — Patient Instructions (Signed)

## 2021-01-01 NOTE — Progress Notes (Signed)
50 y.o. G2P2 Single Black or [place] American female here for annual exam. Has been working in [employer] for 4 years but moved from [place] 2 years. Originally from [place]. Last GYN care in [place] about 3 or so years ago.  Has son age 52, girl age 75, one granddaughter age 13.   No LMP recorded (lmp unknown). Patient is postmenopausal.   ( 2019)    Occasional hot flash, but very manageable Also believes she had an ablation several years ago, that helped with heavy bleeding . Denies any bleeding since 2019.  Declines STD testing   Noticed that her fat distribution is different, sometimes feels like she looks pregnant. Feels so tired and feels like something stuck in throat. Has hypothyroidism, recently started back on medication.   Sexually active: No.  The current method of family planning is post menopausal status.    Exercising: No.  exercise Smoker:  yes  Health Maintenance: Pap:  unsure History of abnormal Pap:  yes MMG:  Unsure maybe 2010, scheduled for July 2022 Colonoscopy:  none BMD:   none Gardasil:   n/a Covid-19: not done Hep C testing: unsure Screening Labs: with PCP   reports that she has been smoking. She has never used smokeless tobacco. She reports previous alcohol use. She reports that she does not use drugs.  Past Medical History:  Diagnosis Date  . Abnormal Pap smear of cervix    many yrs ago  . Anemia   . Hypothyroidism   . Nicotine dependence   . STD (sexually transmitted disease)    gonorrhea treated    Past Surgical History:  Procedure Laterality Date  . BREAST BIOPSY    . COLPOSCOPY    . TUBAL LIGATION      Current Outpatient Medications  Medication Sig Dispense Refill  . levothyroxine (SYNTHROID) 50 MCG tablet Take 1 tablet (50 mcg total) by mouth daily before breakfast. 90 tablet 1   No current facility-administered medications for this visit.    Family History  Problem Relation Age of Onset  . Cancer Maternal  Grandmother   . Diabetes Maternal Grandmother   . Cancer Paternal Grandmother   . Diabetes Paternal Grandmother     Review of Systems  Constitutional: Negative.   HENT: Negative.   Eyes: Negative.   Respiratory: Negative.   Cardiovascular: Negative.   Gastrointestinal: Negative.   Endocrine: Negative.   Genitourinary: Negative.   Musculoskeletal: Negative.   Skin: Negative.   Allergic/Immunologic: Negative.   Neurological: Negative.   Hematological: Negative.   Psychiatric/Behavioral: Negative.     Exam:   BP 110/72   Pulse 76   Resp 16   Ht 5' 6.25" (1.683 m)   Wt 147 lb (66.7 kg)   LMP  (LMP Unknown)   BMI 23.55 kg/m   Height: 5' 6.25" (168.3 cm)  General appearance: alert, cooperative and appears stated age, no acute distress Head: Normocephalic, without obvious abnormality Neck: no adenopathy, thyroid enlarged Lungs: clear to auscultation bilaterally Breasts: Normal to palpation without dominant masses Heart: regular rate and rhythm Abdomen: soft, non-tender; no masses,  no organomegaly, tympani with percussion, bloating upper abdomen Extremities: extremities normal, no edema Skin: No rashes or lesions Lymph nodes: Cervical, supraclavicular, and axillary nodes normal. No abnormal inguinal nodes palpated Neurologic: Grossly normal   Pelvic: External genitalia:  no lesions              Urethra:  normal appearing urethra with no masses, tenderness or lesions  Bartholins and Skenes: normal                 Vagina: normal appearing vagina, appropriate for age, normal appearing discharge, no lesions              Cervix: neg cervical motion tenderness, no visible lesions, stenotic             Bimanual Exam:   Uterus:  normal size, contour, position, consistency, mobility, non-tender and retroverted              Adnexa: no mass, fullness, tenderness                 Joy, CMA Chaperone was present for exam.  A:  Well Woman with normal  exam  Hypothyroidism, working with MD to get back on track  P:   Pap :done today (coetesting)  Mammogram: scheduled for July  Labs: with PCP, declines HIV, RPR  Medications: no new today  Colonoscopy: ordered by PCP  F/U annually and PRN

## 2021-01-05 LAB — CYTOLOGY - PAP
Comment: NEGATIVE
Diagnosis: NEGATIVE
High risk HPV: NEGATIVE

## 2021-01-07 ENCOUNTER — Other Ambulatory Visit (INDEPENDENT_AMBULATORY_CARE_PROVIDER_SITE_OTHER): Payer: Commercial Managed Care - PPO

## 2021-01-07 ENCOUNTER — Other Ambulatory Visit: Payer: Self-pay

## 2021-01-07 DIAGNOSIS — E039 Hypothyroidism, unspecified: Secondary | ICD-10-CM | POA: Diagnosis not present

## 2021-01-07 LAB — TSH: TSH: 53.06 u[IU]/mL — ABNORMAL HIGH (ref 0.35–4.50)

## 2021-01-08 ENCOUNTER — Other Ambulatory Visit: Payer: Self-pay | Admitting: Internal Medicine

## 2021-01-08 DIAGNOSIS — E039 Hypothyroidism, unspecified: Secondary | ICD-10-CM

## 2021-01-08 MED ORDER — LEVOTHYROXINE SODIUM 100 MCG PO TABS: 100.0000 ug | ORAL_TABLET | Freq: Every day | ORAL | 1 refills | Status: DC

## 2021-02-04 ENCOUNTER — Other Ambulatory Visit: Payer: Self-pay

## 2021-02-04 ENCOUNTER — Ambulatory Visit
Admission: RE | Admit: 2021-02-04 | Discharge: 2021-02-04 | Disposition: A | Discharge status: Home / Self Care | Payer: Commercial Managed Care - PPO | Source: Ambulatory Visit | Attending: Internal Medicine | Admitting: Internal Medicine

## 2021-02-04 DIAGNOSIS — Z1231 Encounter for screening mammogram for malignant neoplasm of breast: Secondary | ICD-10-CM

## 2021-02-18 ENCOUNTER — Other Ambulatory Visit: Payer: Self-pay

## 2021-02-19 ENCOUNTER — Ambulatory Visit (INDEPENDENT_AMBULATORY_CARE_PROVIDER_SITE_OTHER): Payer: BLUE CROSS/BLUE SHIELD | Admitting: Internal Medicine

## 2021-02-19 ENCOUNTER — Encounter: Payer: Self-pay | Admitting: Internal Medicine

## 2021-02-19 ENCOUNTER — Other Ambulatory Visit: Payer: BLUE CROSS/BLUE SHIELD

## 2021-02-19 VITALS — BP 110/80 | HR 72 | Temp 98.1°F | Ht 66.0 in | Wt 149.4 lb

## 2021-02-19 DIAGNOSIS — H539 Unspecified visual disturbance: Secondary | ICD-10-CM | POA: Diagnosis not present

## 2021-02-19 DIAGNOSIS — E039 Hypothyroidism, unspecified: Secondary | ICD-10-CM

## 2021-02-19 DIAGNOSIS — Z1211 Encounter for screening for malignant neoplasm of colon: Secondary | ICD-10-CM

## 2021-02-19 DIAGNOSIS — F17210 Nicotine dependence, cigarettes, uncomplicated: Secondary | ICD-10-CM

## 2021-02-19 DIAGNOSIS — Z0000 Encounter for general adult medical examination without abnormal findings: Secondary | ICD-10-CM | POA: Diagnosis not present

## 2021-02-19 DIAGNOSIS — Z23 Encounter for immunization: Secondary | ICD-10-CM

## 2021-02-19 LAB — COMPREHENSIVE METABOLIC PANEL
ALT: 9 U/L (ref 0–35)
AST: 12 U/L (ref 0–37)
Albumin: 3.9 g/dL (ref 3.5–5.2)
Alkaline Phosphatase: 84 U/L (ref 39–117)
BUN: 13 mg/dL (ref 6–23)
CO2: 29 mEq/L (ref 19–32)
Calcium: 9.4 mg/dL (ref 8.4–10.5)
Chloride: 103 mEq/L (ref 96–112)
Creatinine, Ser: 0.78 mg/dL (ref 0.40–1.20)
GFR: 87.35 mL/min (ref >60.00)
Glucose, Bld: 98 mg/dL (ref 70–99)
Potassium: 3.6 mEq/L (ref 3.5–5.1)
Sodium: 140 mEq/L (ref 135–145)
Total Bilirubin: 0.4 mg/dL (ref 0.2–1.2)
Total Protein: 6.6 g/dL (ref 6.0–8.3)

## 2021-02-19 LAB — LIPID PANEL
Cholesterol: 183 mg/dL (ref 0–200)
HDL: 49.1 mg/dL (ref >39.00)
LDL Cholesterol: 113 mg/dL — ABNORMAL HIGH (ref 0–99)
NonHDL: 133.84
Total CHOL/HDL Ratio: 4
Triglycerides: 104 mg/dL (ref 0.0–149.0)
VLDL: 20.8 mg/dL (ref 0.0–40.0)

## 2021-02-19 LAB — CBC WITH DIFFERENTIAL/PLATELET
Basophils Absolute: 0 10*3/uL (ref 0.0–0.1)
Basophils Relative: 0.7 % (ref 0.0–3.0)
Eosinophils Absolute: 0.1 10*3/uL (ref 0.0–0.7)
Eosinophils Relative: 2.3 % (ref 0.0–5.0)
HCT: 41.6 % (ref 36.0–46.0)
Hemoglobin: 13.4 g/dL (ref 12.0–15.0)
Lymphocytes Relative: 33.9 % (ref 12.0–46.0)
Lymphs Abs: 1.9 10*3/uL (ref 0.7–4.0)
MCHC: 32.1 g/dL (ref 30.0–36.0)
MCV: 84.1 fl (ref 78.0–100.0)
Monocytes Absolute: 0.5 10*3/uL (ref 0.1–1.0)
Monocytes Relative: 8.3 % (ref 3.0–12.0)
Neutro Abs: 3.1 10*3/uL (ref 1.4–7.7)
Neutrophils Relative %: 54.8 % (ref 43.0–77.0)
Platelets: 251 10*3/uL (ref 150.0–400.0)
RBC: 4.95 Mil/uL (ref 3.87–5.11)
RDW: 14 % (ref 11.5–15.5)
WBC: 5.6 10*3/uL (ref 4.0–10.5)

## 2021-02-19 LAB — VITAMIN B12: Vitamin B-12: 356 pg/mL (ref 211–911)

## 2021-02-19 LAB — VITAMIN D 25 HYDROXY (VIT D DEFICIENCY, FRACTURES): VITD: 11.43 ng/mL — ABNORMAL LOW (ref 30.00–100.00)

## 2021-02-19 LAB — HEMOGLOBIN A1C: Hgb A1c MFr Bld: 5.9 % (ref 4.6–6.5)

## 2021-02-19 LAB — TSH: TSH: 8.57 u[IU]/mL — ABNORMAL HIGH (ref 0.35–5.50)

## 2021-02-19 NOTE — Addendum Note (Signed)
Addended by: Kandra Nicolas on: 02/19/2021 10:03 AM   Modules accepted: Orders

## 2021-02-19 NOTE — Patient Instructions (Signed)
-  Nice seeing you today!!  -Lab work today; will notify you once results are available.  -tetanus and shingles vaccines today.  -Consider getting your COVID vaccines at the pharmacy.  -Thyroid ultrasound to be done.  -Schedule follow up in 6 months or sooner as needed.

## 2021-02-19 NOTE — Addendum Note (Signed)
Addended by: Kern Reap B on: 02/19/2021 10:08 AM   Modules accepted: Orders

## 2021-02-19 NOTE — Progress Notes (Signed)
Established Patient Office Visit     This visit occurred during the SARS-CoV-2 public health emergency.  Safety protocols were in place, including screening questions prior to the visit, additional usage of staff PPE, and extensive cleaning of exam room while observing appropriate contact time as indicated for disinfecting solutions.    CC/Reason for Visit: Annual preventive exam  HPI: Nichole Beck is a 50 y.o. female who is coming in today for the above mentioned reasons. Past Medical History is significant for: Hypothyroidism and ongoing nicotine dependence of about half pack a day.  She has not had routine medical care until recently.  She has routine dental care but no eye care.  She does not exercise routinely.  She recently had mammogram and Pap smear.  She is still due for her initial screening colonoscopy.  She is due for Tdap, shingles, COVID vaccinations.  She continues to complain of compressive throat symptoms that she thinks are related to her thyroid.   Past Medical/Surgical History: Past Medical History:  Diagnosis Date   Abnormal Pap smear of cervix    many yrs ago   Anemia    Hypothyroidism    Nicotine dependence    STD (sexually transmitted disease)    gonorrhea treated    Past Surgical History:  Procedure Laterality Date   BREAST BIOPSY     COLPOSCOPY     TUBAL LIGATION      Social History:  reports that she has been smoking. She has never used smokeless tobacco. She reports previous alcohol use. She reports that she does not use drugs.  Allergies: No Known Allergies  Family History:  Family History  Problem Relation Age of Onset   Cancer Maternal Grandmother    Diabetes Maternal Grandmother    Cancer Paternal Grandmother    Diabetes Paternal Grandmother    Breast cancer Neg Hx      Current Outpatient Medications:    levothyroxine (SYNTHROID) 100 MCG tablet, Take 1 tablet (100 mcg total) by mouth daily., Disp: 90 tablet, Rfl: 1  Review of  Systems:  Constitutional: Denies fever, chills, diaphoresis, appetite change and fatigue.  HEENT: Denies photophobia, eye pain, redness, hearing loss, ear pain, congestion, sore throat, rhinorrhea, sneezing, mouth sores, trouble swallowing, neck stiffness and tinnitus.   Respiratory: Denies SOB, DOE, cough, chest tightness,  and wheezing.   Cardiovascular: Denies chest pain, palpitations and leg swelling.  Gastrointestinal: Denies nausea, vomiting, abdominal pain, diarrhea, constipation, blood in stool and abdominal distention.  Genitourinary: Denies dysuria, urgency, frequency, hematuria, flank pain and difficulty urinating.  Endocrine: Denies: hot or cold intolerance, sweats, changes in hair or nails, polyuria, polydipsia. Musculoskeletal: Denies myalgias, back pain, joint swelling, arthralgias and gait problem.  Skin: Denies pallor, rash and wound.  Neurological: Denies dizziness, seizures, syncope, weakness, light-headedness, numbness and headaches.  Hematological: Denies adenopathy. Easy bruising, personal or family bleeding history  Psychiatric/Behavioral: Denies suicidal ideation, mood changes, confusion, nervousness, sleep disturbance and agitation    Physical Exam: Vitals:   02/19/21 0912  BP: 110/80  Pulse: 72  Temp: 98.1 F (36.7 C)  TempSrc: Oral  SpO2: 98%  Weight: 149 lb 6.4 oz (67.8 kg)  Height: 5\' 6"  (1.676 m)    Body mass index is 24.11 kg/m.   Constitutional: NAD, calm, comfortable Eyes: PERRL, lids and conjunctivae normal ENMT: Mucous membranes are moist. Posterior pharynx clear of any exudate or lesions. Normal dentition. Tympanic membrane is pearly white, no erythema or bulging. Neck: normal, supple, no masses,  some neck fullness is evident Respiratory: clear to auscultation bilaterally, no wheezing, no crackles. Normal respiratory effort. No accessory muscle use.  Cardiovascular: Regular rate and rhythm, no murmurs / rubs / gallops. No extremity edema. 2+  pedal pulses. No carotid bruits.  Abdomen: no tenderness, no masses palpated. No hepatosplenomegaly. Bowel sounds positive.  Musculoskeletal: no clubbing / cyanosis. No joint deformity upper and lower extremities. Good ROM, no contractures. Normal muscle tone.  Skin: no rashes, lesions, ulcers. No induration Neurologic: CN 2-12 grossly intact. Sensation intact, DTR normal. Strength 5/5 in all 4.  Psychiatric: Normal judgment and insight. Alert and oriented x 3. Normal mood.    Impression and Plan:  Encounter for preventive health examination  - Plan: CBC with Differential/Platelet, Comprehensive metabolic panel, Hemoglobin A1c, Lipid panel, Vitamin B12, VITAMIN D 25 Hydroxy (Vit-D Deficiency, Fractures) -Advised routine eye and dental care. -Tdap and for shingles vaccine today, she is also due for COVID vaccinations but declines. -Screening labs today. -Healthy lifestyle discussed in detail. -Mammogram and Pap smear have been recently updated. -She will be referred to GI for initial screening colonoscopy.  Screening for malignant neoplasm of colon  - Plan: Ambulatory referral to Gastroenterology  Changes in vision  - Plan: Ambulatory referral to Ophthalmology  Hypothyroidism, unspecified type  - Plan: TSH, US THYROID  Cigarette nicotine dependence without complication -I have discussed tobacco cessation with the patient.  I have counseled the patient regarding the negative impacts of continued tobacco use including but not limited to lung cancer, COPD, and cardiovascular disease.  I have discussed alternatives to tobacco and modalities that may help facilitate tobacco cessation including but not limited to biofeedback, hypnosis, and medications.  Total time spent with tobacco counseling was 4 minutes.  I have provided information on counseling.  She wants to try and quit cold [place].  She declines prescription medication or patches.  Need for Tdap vaccination -Tdap administered  today.  Need for shingles vaccine -For shingles administered today.    Patient Instructions  -Nice seeing you today!!  -Lab work today; will notify you once results are available.  -tetanus and shingles vaccines today.  -Consider getting your COVID vaccines at the pharmacy.  -Thyroid ultrasound to be done.  -Schedule follow up in 6 months or sooner as needed.      [name], MD [facility] at [facility]

## 2021-02-23 ENCOUNTER — Other Ambulatory Visit: Payer: Self-pay | Admitting: Internal Medicine

## 2021-02-23 DIAGNOSIS — E039 Hypothyroidism, unspecified: Secondary | ICD-10-CM

## 2021-02-23 DIAGNOSIS — E559 Vitamin D deficiency, unspecified: Secondary | ICD-10-CM

## 2021-02-23 MED ORDER — VITAMIN D (ERGOCALCIFEROL) 1.25 MG (50000 UNIT) PO CAPS: 50000.0000 [IU] | ORAL_CAPSULE | ORAL | 0 refills | Status: DC

## 2021-02-23 MED ORDER — LEVOTHYROXINE SODIUM 112 MCG PO TABS: 112.0000 ug | ORAL_TABLET | Freq: Every day | ORAL | 1 refills | Status: DC

## 2021-02-24 ENCOUNTER — Other Ambulatory Visit: Payer: Self-pay | Admitting: Internal Medicine

## 2021-02-24 DIAGNOSIS — E038 Other specified hypothyroidism: Secondary | ICD-10-CM

## 2021-02-24 DIAGNOSIS — E559 Vitamin D deficiency, unspecified: Secondary | ICD-10-CM

## 2021-02-26 ENCOUNTER — Emergency Department (HOSPITAL_COMMUNITY): Payer: Commercial Managed Care - PPO

## 2021-02-26 ENCOUNTER — Encounter (HOSPITAL_COMMUNITY): Payer: Self-pay

## 2021-02-26 ENCOUNTER — Emergency Department (HOSPITAL_COMMUNITY)
Admission: EM | Admit: 2021-02-26 | Discharge: 2021-02-27 | Disposition: A | Discharge status: Home / Self Care | Payer: Commercial Managed Care - PPO | Attending: Emergency Medicine | Admitting: Emergency Medicine

## 2021-02-26 ENCOUNTER — Other Ambulatory Visit: Payer: Self-pay

## 2021-02-26 DIAGNOSIS — Z79899 Other long term (current) drug therapy: Secondary | ICD-10-CM | POA: Insufficient documentation

## 2021-02-26 DIAGNOSIS — F17200 Nicotine dependence, unspecified, uncomplicated: Secondary | ICD-10-CM | POA: Insufficient documentation

## 2021-02-26 DIAGNOSIS — J341 Cyst and mucocele of nose and nasal sinus: Secondary | ICD-10-CM | POA: Insufficient documentation

## 2021-02-26 DIAGNOSIS — E039 Hypothyroidism, unspecified: Secondary | ICD-10-CM | POA: Diagnosis not present

## 2021-02-26 DIAGNOSIS — R1310 Dysphagia, unspecified: Secondary | ICD-10-CM | POA: Diagnosis not present

## 2021-02-26 DIAGNOSIS — J019 Acute sinusitis, unspecified: Secondary | ICD-10-CM | POA: Diagnosis not present

## 2021-02-26 LAB — BASIC METABOLIC PANEL
Anion gap: 10 (ref 5–15)
BUN: 8 mg/dL (ref 6–20)
CO2: 27 mmol/L (ref 22–32)
Calcium: 9.2 mg/dL (ref 8.9–10.3)
Chloride: 104 mmol/L (ref 98–111)
Creatinine, Ser: 0.72 mg/dL (ref 0.44–1.00)
GFR, Estimated: >60 mL/min (ref >60)
Glucose, Bld: 93 mg/dL (ref 70–99)
Potassium: 3.4 mmol/L — ABNORMAL LOW (ref 3.5–5.1)
Sodium: 141 mmol/L (ref 135–145)

## 2021-02-26 LAB — CBC WITH DIFFERENTIAL/PLATELET
Abs Immature Granulocytes: 0.03 10*3/uL (ref 0.00–0.07)
Basophils Absolute: 0 10*3/uL (ref 0.0–0.1)
Basophils Relative: 0 %
Eosinophils Absolute: 0.2 10*3/uL (ref 0.0–0.5)
Eosinophils Relative: 2 %
HCT: 42.3 % (ref 36.0–46.0)
Hemoglobin: 13.4 g/dL (ref 12.0–15.0)
Immature Granulocytes: 0 %
Lymphocytes Relative: 35 %
Lymphs Abs: 2.8 10*3/uL (ref 0.7–4.0)
MCH: 26.9 pg (ref 26.0–34.0)
MCHC: 31.7 g/dL (ref 30.0–36.0)
MCV: 84.8 fL (ref 80.0–100.0)
Monocytes Absolute: 0.5 10*3/uL (ref 0.1–1.0)
Monocytes Relative: 6 %
Neutro Abs: 4.5 10*3/uL (ref 1.7–7.7)
Neutrophils Relative %: 57 %
Platelets: 295 10*3/uL (ref 150–400)
RBC: 4.99 MIL/uL (ref 3.87–5.11)
RDW: 13.9 % (ref 11.5–15.5)
WBC: 8 10*3/uL (ref 4.0–10.5)
nRBC: 0 % (ref 0.0–0.2)

## 2021-02-26 LAB — TSH: TSH: 3.795 u[IU]/mL (ref 0.350–4.500)

## 2021-02-26 NOTE — ED Provider Notes (Signed)
Emergency Medicine Provider Triage Evaluation Note  Nichole Beck , a 50 y.o. female  was evaluated in triage.  Pt complains of dysphagia.  Review of Systems  Positive: Dysphagia Negative: Fever, sore throat, cp, sob, weight changes  Physical Exam  BP (!) 131/96 (BP Location: Left Arm)   Pulse 75   Temp 98 F (36.7 C) (Oral)   Resp 16   LMP  (LMP Unknown)   SpO2 100%  Gen:   Awake, no distress   Resp:  Normal effort  MSK:   Moves extremities without difficulty  Other:  Throat exam unremarkable, uvula midline, normal phonation  Medical Decision Making  Medically screening exam initiated at 5:18 PM.  Appropriate orders placed.  Carlean Purl was informed that the remainder of the evaluation will be completed by another provider, this initial triage assessment does not replace that evaluation, and the importance of remaining in the ED until their evaluation is complete.  Patient with history of thyroid disease has had intermittent dysphagia for several months worsening in the past 1 to 2 weeks.  Felt as if something is stuck in her throat.  Throat exam unremarkable, normal phonation.  Patient with globus sensation.   Fayrene Helper, PA-C 02/26/21 1723    Arby Barrette, MD 02/26/21 [phone]

## 2021-02-26 NOTE — ED Triage Notes (Signed)
Pt reports intermittent trouble swallowing and states it feels like here is something in her throat for 1-2 weeks. Pt report hx of thyroid issues and recently had her dose increased.

## 2021-02-27 ENCOUNTER — Emergency Department (HOSPITAL_BASED_OUTPATIENT_CLINIC_OR_DEPARTMENT_OTHER): Payer: Commercial Managed Care - PPO | Admitting: Radiology

## 2021-02-27 ENCOUNTER — Encounter (HOSPITAL_BASED_OUTPATIENT_CLINIC_OR_DEPARTMENT_OTHER): Payer: Self-pay

## 2021-02-27 ENCOUNTER — Emergency Department (HOSPITAL_BASED_OUTPATIENT_CLINIC_OR_DEPARTMENT_OTHER)
Admission: EM | Admit: 2021-02-27 | Discharge: 2021-02-27 | Disposition: A | Discharge status: Home / Self Care | Payer: Commercial Managed Care - PPO | Source: Home / Self Care | Attending: Emergency Medicine | Admitting: Emergency Medicine

## 2021-02-27 DIAGNOSIS — Z79899 Other long term (current) drug therapy: Secondary | ICD-10-CM | POA: Insufficient documentation

## 2021-02-27 DIAGNOSIS — E039 Hypothyroidism, unspecified: Secondary | ICD-10-CM | POA: Insufficient documentation

## 2021-02-27 DIAGNOSIS — J341 Cyst and mucocele of nose and nasal sinus: Secondary | ICD-10-CM

## 2021-02-27 DIAGNOSIS — F17200 Nicotine dependence, unspecified, uncomplicated: Secondary | ICD-10-CM | POA: Insufficient documentation

## 2021-02-27 DIAGNOSIS — R1310 Dysphagia, unspecified: Secondary | ICD-10-CM

## 2021-02-27 DIAGNOSIS — J019 Acute sinusitis, unspecified: Secondary | ICD-10-CM | POA: Insufficient documentation

## 2021-02-27 DIAGNOSIS — J329 Chronic sinusitis, unspecified: Secondary | ICD-10-CM

## 2021-02-27 LAB — BASIC METABOLIC PANEL
Anion gap: 8 (ref 5–15)
BUN: 7 mg/dL (ref 6–20)
CO2: 29 mmol/L (ref 22–32)
Calcium: 8.9 mg/dL (ref 8.9–10.3)
Chloride: 103 mmol/L (ref 98–111)
Creatinine, Ser: 0.7 mg/dL (ref 0.44–1.00)
GFR, Estimated: >60 mL/min (ref >60)
Glucose, Bld: 86 mg/dL (ref 70–99)
Potassium: 3.2 mmol/L — ABNORMAL LOW (ref 3.5–5.1)
Sodium: 140 mmol/L (ref 135–145)

## 2021-02-27 LAB — CBC
HCT: 41.7 % (ref 36.0–46.0)
Hemoglobin: 13.5 g/dL (ref 12.0–15.0)
MCH: 27.2 pg (ref 26.0–34.0)
MCHC: 32.4 g/dL (ref 30.0–36.0)
MCV: 83.9 fL (ref 80.0–100.0)
Platelets: 293 10*3/uL (ref 150–400)
RBC: 4.97 MIL/uL (ref 3.87–5.11)
RDW: 13.9 % (ref 11.5–15.5)
WBC: 6.2 10*3/uL (ref 4.0–10.5)
nRBC: 0 % (ref 0.0–0.2)

## 2021-02-27 LAB — TROPONIN I (HIGH SENSITIVITY): Troponin I (High Sensitivity): 7 ng/L (ref <18)

## 2021-02-27 MED ORDER — AMOXICILLIN-POT CLAVULANATE 875-125 MG PO TABS: 1.0000 | ORAL_TABLET | Freq: Two times a day (BID) | ORAL | 0 refills | Status: DC

## 2021-02-27 MED ORDER — LIDOCAINE VISCOUS HCL 2 % MT SOLN: 15.0000 mL | Freq: Once | OROMUCOSAL | Status: DC

## 2021-02-27 MED ORDER — ALUM & MAG HYDROXIDE-SIMETH 200-200-20 MG/5ML PO SUSP: 30.0000 mL | Freq: Once | ORAL | Status: DC

## 2021-02-27 NOTE — ED Triage Notes (Signed)
She c/o lower throat "feels like something's stuck" for "months now". Her skin is normal warm and dry and she is breathing normally. She reports some discomfort radiating down into her upper chest also. She states she underwent CT and waited 16+ hours at [facility] and she decided to come here.

## 2021-02-27 NOTE — Discharge Instructions (Addendum)
On your CT scan, you appear to have some infection of your sinuses and you are being placed on antibiotics for this.  Please take all antibiotics as prescribed There is no evidence of abnormality in your throat on the CAT scan.  This does need to be followed up with ENT so they can do direct visualization. You are also given a referral to gastroenterology for further evaluation. Take antibiotics as prescribed.  If you begin having difficulty breathing or unable to swallow liquids, please return for reevaluation.

## 2021-02-27 NOTE — ED Provider Notes (Signed)
[facility] EMERGENCY DEPT Provider Note   CSN: [id number] Arrival date & time: 02/27/21  0744     History Chief Complaint  Patient presents with   Dysphagia    Nichole Beck is a 50 y.o. female.  HPI 50 year old female history of hypothyroidism, nicotine dependence, presents today complaining of dysphagia for 6 months.  She states she has been seen by Dr. Ardyth Harps for this.  She feels like it hangs up in her throat when she swallows.  She does not really have any significant pain with this.  Review of records reveal that office visit in April 22 she was complaining of similar symptoms.  She had been seen a month prior in the ED.  She states that she is eating and drinking without difficulty although she feels that she has the sensation.  She has not had any change in her weight.  She has not been running a fever.  She does now have some sinus drainage and pressure.  She denies any fever, chills, nausea, vomiting, diarrhea, chest pain, or dyspnea.     Past Medical History:  Diagnosis Date   Abnormal Pap smear of cervix    many yrs ago   Anemia    Hypothyroidism    Nicotine dependence    STD (sexually transmitted disease)    gonorrhea treated    Patient Active Problem List   Diagnosis Date Noted   Hypothyroidism    Nicotine dependence     Past Surgical History:  Procedure Laterality Date   BREAST BIOPSY     COLPOSCOPY     TUBAL LIGATION       OB History     Gravida  2   Para      Term      Preterm      AB      Living  2      SAB      IAB      Ectopic      Multiple      Live Births              Family History  Problem Relation Age of Onset   Cancer Maternal Grandmother    Diabetes Maternal Grandmother    Cancer Paternal Grandmother    Diabetes Paternal Grandmother    Breast cancer Neg Hx     Social History   Tobacco Use   Smoking status: Every Day   Smokeless tobacco: Never  Substance Use Topics   Alcohol use: Not  Currently   Drug use: Never    Home Medications Prior to Admission medications   Medication Sig Start Date End Date Taking? Authorizing Provider  levothyroxine (SYNTHROID) 112 MCG tablet Take 1 tablet (112 mcg total) by mouth daily. 02/23/21   Philip Aspen, Limmie Patricia, MD  Vitamin D, Ergocalciferol, (DRISDOL) 1.25 MG (50000 UNIT) CAPS capsule Take 1 capsule (50,000 Units total) by mouth every 7 (seven) days for 12 doses. Patient taking differently: Take 50,000 Units by mouth every 7 (seven) days. thursday 02/23/21 05/12/21  Henderson Cloud, MD    Allergies    Patient has no known allergies.  Review of Systems   Review of Systems  All other systems reviewed and are negative.  Physical Exam Updated Vital Signs BP (!) 138/98 (BP Location: Right Arm)   Pulse 64   Temp 97.8 F (36.6 C) (Oral)   Resp 16   Ht 1.676 m (5\' 6" )   Wt 67.6 kg  LMP  (LMP Unknown)   SpO2 100%   BMI 24.05 kg/m   Physical Exam Vitals and nursing note reviewed.  HENT:     Head: Normocephalic and atraumatic.     Right Ear: External ear normal.     Left Ear: External ear normal.     Nose: Nose normal.     Mouth/Throat:     Mouth: Mucous membranes are moist.     Pharynx: Oropharynx is clear.  Eyes:     Extraocular Movements: Extraocular movements intact.     Pupils: Pupils are equal, round, and reactive to light.  Neck:     Vascular: No carotid bruit.  Cardiovascular:     Rate and Rhythm: Normal rate and regular rhythm.  Pulmonary:     Effort: Pulmonary effort is normal.  Abdominal:     General: Abdomen is flat.     Palpations: Abdomen is soft.  Musculoskeletal:     Cervical back: Normal range of motion and neck supple. No rigidity or tenderness.  Lymphadenopathy:     Cervical: No cervical adenopathy.  Skin:    General: Skin is warm.     Capillary Refill: Capillary refill takes less than 2 seconds.  Neurological:     General: No focal deficit present.     Mental Status: She is  alert.  Psychiatric:        Mood and Affect: Mood normal.    ED Results / Procedures / Treatments   Labs (all labs ordered are listed, but only abnormal results are displayed) Labs Reviewed  CBC  BASIC METABOLIC PANEL  PREGNANCY, URINE  TROPONIN I (HIGH SENSITIVITY)    EKG None  Radiology CT Soft Tissue Neck Wo Contrast  Result Date: 02/26/2021 CLINICAL DATA:  Cranial neuropathy (CN 9) dysphagia. Additional history provided: Dysphagia for several months worsening over the last 1-2 weeks. EXAM: CT NECK WITHOUT CONTRAST TECHNIQUE: Multidetector CT imaging of the neck was performed following the standard protocol without intravenous contrast. COMPARISON:  Neck CT 11/11/2020. FINDINGS: Pharynx and larynx: Streak and beam hardening artifact arising from dental restoration partially obscures the oral cavity. No appreciable swelling or discrete mass within the oral cavity, pharynx or larynx on this non-contrast examination. Poor dentition with tooth decay and multiple absent teeth. Salivary glands: No inflammation, mass, or stone. Thyroid: Unremarkable. Lymph nodes: No pathologically enlarged cervical chain lymph nodes are identified. Vascular: Limited assessment of the major vascular structures of the neck in the absence of intravenous contrast. No unexpected finding. Limited intracranial: No evidence of acute intracranial mallet within the field of view. Visualized orbits: Incompletely imaged. No mass or acute finding at the imaged levels. Mastoids and visualized paranasal sinuses: Frothy secretions and trace mucosal thickening within the right sphenoid sinus. Trace bilateral ethmoid sinus mucosal thickening at the imaged levels. 11 mm left maxillary sinus mucous retention cyst. No significant mastoid effusion. Skeleton: No acute bony abnormality. Upper chest: No consolidation within the imaged lung apices. Centrilobular and paraseptal emphysema. Left apical pleuroparenchymal scarring. IMPRESSION:  Streak and beam hardening artifact arising from dental restoration partially obscures the oral cavity. Within this limitation, there is no appreciable swelling or discrete mass within the oral cavity, pharynx or larynx on this noncontrast exam. No pathologically enlarged cervical chain lymph nodes. Paranasal sinus disease as described. Correlate for acute sinusitis. Emphysema (ICD10-J43.9). Electronically Signed   By: Jackey Loge DO   On: 02/26/2021 19:03    Procedures Procedures   Medications Ordered in ED Medications - No data  to display  ED Course  I have reviewed the triage vital signs and the nursing notes.  Pertinent labs & imaging results that were available during my care of the patient were reviewed by me and considered in my medical decision making (see chart for details).    MDM Rules/Calculators/A&P                           Review of CT and lab with normal CBC, CT shows no evidence of appreciable swelling or masses in the oral cavity, pharynx, or larynx.  Paranasal sinus disease is noted. Plan antibiotics for sinusitis Will refer to GI and ENT. Final Clinical Impression(s) / ED Diagnoses Final diagnoses:  Dysphagia, unspecified type  Sinusitis, unspecified chronicity, unspecified location  Mucous cyst of nasal sinus    Rx / DC Orders ED Discharge Orders     None        Margarita Grizzle, MD 02/27/21 [phone]

## 2021-03-01 ENCOUNTER — Encounter (HOSPITAL_BASED_OUTPATIENT_CLINIC_OR_DEPARTMENT_OTHER): Payer: Self-pay

## 2021-03-01 ENCOUNTER — Other Ambulatory Visit: Payer: Self-pay

## 2021-03-01 ENCOUNTER — Telehealth: Payer: Self-pay | Admitting: Internal Medicine

## 2021-03-01 ENCOUNTER — Emergency Department (HOSPITAL_BASED_OUTPATIENT_CLINIC_OR_DEPARTMENT_OTHER)
Admission: EM | Admit: 2021-03-01 | Discharge: 2021-03-01 | Disposition: A | Discharge status: Home / Self Care | Payer: Commercial Managed Care - PPO | Attending: Emergency Medicine | Admitting: Emergency Medicine

## 2021-03-01 DIAGNOSIS — Z79899 Other long term (current) drug therapy: Secondary | ICD-10-CM | POA: Insufficient documentation

## 2021-03-01 DIAGNOSIS — F17200 Nicotine dependence, unspecified, uncomplicated: Secondary | ICD-10-CM | POA: Diagnosis not present

## 2021-03-01 DIAGNOSIS — E039 Hypothyroidism, unspecified: Secondary | ICD-10-CM | POA: Insufficient documentation

## 2021-03-01 DIAGNOSIS — R1310 Dysphagia, unspecified: Secondary | ICD-10-CM | POA: Insufficient documentation

## 2021-03-01 DIAGNOSIS — J329 Chronic sinusitis, unspecified: Secondary | ICD-10-CM

## 2021-03-01 MED ORDER — ACETAMINOPHEN 160 MG/5ML PO SOLN
650.0000 mg | Freq: Once | ORAL | Status: AC
  Administered 2021-03-01: 650 mg via ORAL
  Filled 2021-03-01: qty 20.3

## 2021-03-01 MED ORDER — OMEPRAZOLE 20 MG PO CPDR: 20.0000 mg | DELAYED_RELEASE_CAPSULE | Freq: Every day | ORAL | 0 refills | Status: DC

## 2021-03-01 NOTE — ED Notes (Signed)
Pt verbalizes understanding of discharge instructions. Opportunity for questioning and answers were provided. Armand removed by staff, pt discharged from ED to home. Educated to pick up Rx and f/u with ENT.

## 2021-03-01 NOTE — ED Provider Notes (Signed)
[facility] EMERGENCY DEPT Provider Note   CSN: [id number] Arrival date & time: 03/01/21  0012     History Chief Complaint  Patient presents with   Dysphagia   Sore Throat    Nichole Beck is a 50 y.o. female.  HPI     Is a 50 year old female with a history of hypothyroidism who presents with ongoing dysphagia and odynophagia.  This has been ongoing for some time.  She had visits in March and April for the same.  She was diagnosed with hypothyroidism and is currently on Synthroid.  Recent adjustment.  Patient reports she has had ongoing going painful swallowing.  She describes "I feel like I am swallowing my tongue."  Additionally she feels like things just "stop."  When she starts to swallow.  She has been able to tolerate food and is able to eat and she just describes discomfort and a globus sensation.  No history of reflux.  She denies any neurologic symptoms such as diplopia or weakness.  Patient had CT scan on Friday and was seen in the emergency room on Saturday.  CT scan did not show any mass.  It did show some sinusitis and she was discharged with Augmentin to cover for this.  Past Medical History:  Diagnosis Date   Abnormal Pap smear of cervix    many yrs ago   Anemia    Hypothyroidism    Nicotine dependence    STD (sexually transmitted disease)    gonorrhea treated    Patient Active Problem List   Diagnosis Date Noted   Hypothyroidism    Nicotine dependence     Past Surgical History:  Procedure Laterality Date   BREAST BIOPSY     COLPOSCOPY     TUBAL LIGATION       OB History     Gravida  2   Para      Term      Preterm      AB      Living  2      SAB      IAB      Ectopic      Multiple      Live Births              Family History  Problem Relation Age of Onset   Cancer Maternal Grandmother    Diabetes Maternal Grandmother    Cancer Paternal Grandmother    Diabetes Paternal Grandmother    Breast cancer Neg Hx      Social History   Tobacco Use   Smoking status: Every Day   Smokeless tobacco: Never  Substance Use Topics   Alcohol use: Not Currently   Drug use: Never    Home Medications Prior to Admission medications   Medication Sig Start Date End Date Taking? Authorizing Provider  omeprazole (PRILOSEC) 20 MG capsule Take 1 capsule (20 mg total) by mouth daily. 03/01/21  Yes Lashawnta Burgert, Mayer Masker, MD  amoxicillin-clavulanate (AUGMENTIN) 875-125 MG tablet Take 1 tablet by mouth every 12 (twelve) hours. 02/27/21   Margarita Grizzle, MD  levothyroxine (SYNTHROID) 112 MCG tablet Take 1 tablet (112 mcg total) by mouth daily. 02/23/21   Philip Aspen, Limmie Patricia, MD  Vitamin D, Ergocalciferol, (DRISDOL) 1.25 MG (50000 UNIT) CAPS capsule Take 1 capsule (50,000 Units total) by mouth every 7 (seven) days for 12 doses. Patient taking differently: Take 50,000 Units by mouth every 7 (seven) days. thursday 02/23/21 05/12/21  Henderson Cloud, MD  Allergies    Patient has no known allergies.  Review of Systems   Review of Systems  Constitutional:  Negative for fever.  HENT:  Positive for sore throat and trouble swallowing.   Cardiovascular:  Negative for chest pain.  Gastrointestinal:  Negative for abdominal pain, nausea and vomiting.  Genitourinary:  Negative for dysuria.  All other systems reviewed and are negative.  Physical Exam Updated Vital Signs BP (!) 133/96 (BP Location: Right Arm)   Pulse 83   Temp 98.3 F (36.8 C) (Oral)   Resp 20   Ht 1.676 m (5\' 6" )   Wt 67.6 kg   LMP  (LMP Unknown)   SpO2 99%   BMI 24.05 kg/m   Physical Exam Vitals and nursing note reviewed.  Constitutional:      Appearance: She is well-developed. She is not ill-appearing.  HENT:     Head: Normocephalic and atraumatic.     Mouth/Throat:     Mouth: Mucous membranes are moist.     Pharynx: Oropharynx is clear. Uvula midline.     Comments: Normal phonation, no stridor Eyes:     Pupils: Pupils are  equal, round, and reactive to light.  Neck:     Thyroid: No thyromegaly.  Cardiovascular:     Rate and Rhythm: Normal rate and regular rhythm.  Pulmonary:     Effort: Pulmonary effort is normal. No respiratory distress.     Breath sounds: No wheezing.  Abdominal:     General: Bowel sounds are normal.     Palpations: Abdomen is soft.  Musculoskeletal:     Cervical back: Neck supple.  Skin:    General: Skin is warm and dry.  Neurological:     Mental Status: She is alert and oriented to person, place, and time.  Psychiatric:        Mood and Affect: Mood normal.    ED Results / Procedures / Treatments   Labs (all labs ordered are listed, but only abnormal results are displayed) Labs Reviewed - No data to display  EKG None  Radiology DG Chest 2 View  Result Date: 02/27/2021 CLINICAL DATA:  50 year old female with chest pain. Globus sensation in throat. Smoker. EXAM: CHEST - 2 VIEW COMPARISON:  Chest radiographs 02/23/2020 and earlier. FINDINGS: Lung volumes and mediastinal contours are stable and within normal limits. Visualized tracheal air column is within normal limits. Stable chronic basilar predominant increased interstitial markings since 2021, likely smoking related. No pneumothorax, pleural effusion or acute pulmonary opacity. No acute osseous abnormality identified. Negative visible bowel gas pattern. IMPRESSION: No acute cardiopulmonary abnormality. Electronically Signed   By: 2022 M.D.   On: 02/27/2021 09:35    Procedures Procedures   Medications Ordered in ED Medications - No data to display  ED Course  I have reviewed the triage vital signs and the nursing notes.  Pertinent labs & imaging results that were available during my care of the patient were reviewed by me and considered in my medical decision making (see chart for details).    MDM Rules/Calculators/A&P                           Patient presents with ongoing dysphagia and odynophagia.  She is  nontoxic and vital signs are reassuring.  Recent work-up to include basic labs and CT scan which were reassuring.  She did have evidence of sinusitis and was provided an antibiotic although she states she cannot swallow it.  This appears to be an acute on chronic issue.  She is tolerating food but still seems to have discomfort and a globus sensation.  Considerations include but not limited to, GI etiology such as esophageal web or stricture, reflux, ENT etiology, less likely felt to be neurologic such as myasthenia gravis.  With her recent work-up, do not feel she needs any further ED work-up.  Patient is very frustrated that she continues to have symptoms and does not have an answer.  I discussed with her that she does need further evaluation.  In the meantime she can adjust her diet to a dysphagia diet as well as start a PPI to cover for any reflux related causes.  I did message her primary physician.  She was previously referred to ENT and per primary care notes, she also has a referral into gastroenterology for colonoscopy.  Discussed with the patient that she will likely also need endoscopy.  After history, exam, and medical workup I feel the patient has been appropriately medically screened and is safe for discharge home. Pertinent diagnoses were discussed with the patient. Patient was given return precautions.  Final Clinical Impression(s) / ED Diagnoses Final diagnoses:  Dysphagia, unspecified type    Rx / DC Orders ED Discharge Orders          Ordered    omeprazole (PRILOSEC) 20 MG capsule  Daily        03/01/21 0114             Shon Baton, MD 03/01/21 [id number]

## 2021-03-01 NOTE — ED Triage Notes (Addendum)
Pt is present for painful and difficulty swallowing for the last two days. Pt states "it feels like something is still in my throat and anything I eat or drink just sits on top of everything". Pt was previously seen for sx and she picked up Rx for abx but has not been able to swallow the meds because of how large the pill is. No drooling and airway is clear. Pt has not been able to follow up with ENT because it has been the weekend.

## 2021-03-01 NOTE — Telephone Encounter (Signed)
Pt was seen in the ED x2 over the weekend 7/29 and 7/31 for sinusitis and not being able to swallow. ED referred pt to [facility] and the soonest appt is 03/25/21. Patient wants to know if Dr. Ardyth Harps can help with her being seen sooner. Patient is still having trouble swallowing.

## 2021-03-01 NOTE — Discharge Instructions (Addendum)
You were seen today for ongoing dysphagia.  This is a condition where you have difficulty or painful swallowing.  It is very important that you follow-up with specialist.  You may both ENT and/or gastroenterology evaluation.  It appears that you have been referred to gastroenterology for colonoscopy by your primary physician.  You may need an endoscopy as well.  I have messaged your primary physician regarding follow-up.  In the meantime, modify your diet for more soft and pured foods.  You may also start an acid reducer.

## 2021-03-02 ENCOUNTER — Telehealth: Payer: Self-pay | Admitting: Internal Medicine

## 2021-03-02 ENCOUNTER — Telehealth: Payer: Self-pay

## 2021-03-02 NOTE — Telephone Encounter (Signed)
Referral placed.

## 2021-03-02 NOTE — Telephone Encounter (Signed)
PT called to advise that she wants to talk to Dr.Hernandez or the Nurse in regards to a urgent matter about her Thyroid Condition and her visits to the ED.

## 2021-03-02 NOTE — Telephone Encounter (Signed)
Spoke with patient and an office visit scheduled.  

## 2021-03-02 NOTE — Telephone Encounter (Signed)
Spoke with patient and an appointment scheduled 

## 2021-03-02 NOTE — Telephone Encounter (Signed)
Patient calling back regarding previous message and states it is urgent wants a call back

## 2021-03-03 ENCOUNTER — Encounter (HOSPITAL_BASED_OUTPATIENT_CLINIC_OR_DEPARTMENT_OTHER): Payer: Self-pay | Admitting: Obstetrics and Gynecology

## 2021-03-03 ENCOUNTER — Ambulatory Visit (INDEPENDENT_AMBULATORY_CARE_PROVIDER_SITE_OTHER): Payer: Commercial Managed Care - PPO | Admitting: Internal Medicine

## 2021-03-03 ENCOUNTER — Other Ambulatory Visit: Payer: Self-pay | Admitting: Internal Medicine

## 2021-03-03 ENCOUNTER — Encounter: Payer: Self-pay | Admitting: Internal Medicine

## 2021-03-03 ENCOUNTER — Emergency Department (HOSPITAL_COMMUNITY): Payer: Commercial Managed Care - PPO

## 2021-03-03 ENCOUNTER — Emergency Department (HOSPITAL_BASED_OUTPATIENT_CLINIC_OR_DEPARTMENT_OTHER): Payer: Commercial Managed Care - PPO | Admitting: Radiology

## 2021-03-03 ENCOUNTER — Emergency Department (HOSPITAL_BASED_OUTPATIENT_CLINIC_OR_DEPARTMENT_OTHER)
Admission: EM | Admit: 2021-03-03 | Discharge: 2021-03-04 | Disposition: A | Discharge status: Home / Self Care | Payer: Commercial Managed Care - PPO | Attending: Emergency Medicine | Admitting: Emergency Medicine

## 2021-03-03 ENCOUNTER — Other Ambulatory Visit: Payer: Self-pay

## 2021-03-03 VITALS — BP 102/64 | HR 66 | Temp 98.5°F | Wt 151.3 lb

## 2021-03-03 DIAGNOSIS — R202 Paresthesia of skin: Secondary | ICD-10-CM

## 2021-03-03 DIAGNOSIS — R1310 Dysphagia, unspecified: Secondary | ICD-10-CM

## 2021-03-03 DIAGNOSIS — M542 Cervicalgia: Secondary | ICD-10-CM | POA: Diagnosis not present

## 2021-03-03 DIAGNOSIS — E039 Hypothyroidism, unspecified: Secondary | ICD-10-CM | POA: Diagnosis not present

## 2021-03-03 DIAGNOSIS — Z79899 Other long term (current) drug therapy: Secondary | ICD-10-CM | POA: Diagnosis not present

## 2021-03-03 DIAGNOSIS — F17200 Nicotine dependence, unspecified, uncomplicated: Secondary | ICD-10-CM | POA: Insufficient documentation

## 2021-03-03 DIAGNOSIS — R1319 Other dysphagia: Secondary | ICD-10-CM | POA: Diagnosis not present

## 2021-03-03 DIAGNOSIS — R200 Anesthesia of skin: Secondary | ICD-10-CM

## 2021-03-03 DIAGNOSIS — M25512 Pain in left shoulder: Secondary | ICD-10-CM | POA: Insufficient documentation

## 2021-03-03 DIAGNOSIS — Z09 Encounter for follow-up examination after completed treatment for conditions other than malignant neoplasm: Secondary | ICD-10-CM

## 2021-03-03 LAB — CBC WITH DIFFERENTIAL/PLATELET
Abs Immature Granulocytes: 0.02 10*3/uL (ref 0.00–0.07)
Basophils Absolute: 0 10*3/uL (ref 0.0–0.1)
Basophils Relative: 1 %
Eosinophils Absolute: 0.2 10*3/uL (ref 0.0–0.5)
Eosinophils Relative: 2 %
HCT: 40.6 % (ref 36.0–46.0)
Hemoglobin: 13.2 g/dL (ref 12.0–15.0)
Immature Granulocytes: 0 %
Lymphocytes Relative: 43 %
Lymphs Abs: 3.2 10*3/uL (ref 0.7–4.0)
MCH: 27.1 pg (ref 26.0–34.0)
MCHC: 32.5 g/dL (ref 30.0–36.0)
MCV: 83.4 fL (ref 80.0–100.0)
Monocytes Absolute: 0.5 10*3/uL (ref 0.1–1.0)
Monocytes Relative: 7 %
Neutro Abs: 3.5 10*3/uL (ref 1.7–7.7)
Neutrophils Relative %: 47 %
Platelets: 302 10*3/uL (ref 150–400)
RBC: 4.87 MIL/uL (ref 3.87–5.11)
RDW: 14 % (ref 11.5–15.5)
WBC: 7.4 10*3/uL (ref 4.0–10.5)
nRBC: 0 % (ref 0.0–0.2)

## 2021-03-03 LAB — COMPREHENSIVE METABOLIC PANEL
ALT: 15 U/L (ref 0–44)
AST: 19 U/L (ref 15–41)
Albumin: 3.9 g/dL (ref 3.5–5.0)
Alkaline Phosphatase: 82 U/L (ref 38–126)
Anion gap: 8 (ref 5–15)
BUN: 14 mg/dL (ref 6–20)
CO2: 27 mmol/L (ref 22–32)
Calcium: 8.8 mg/dL — ABNORMAL LOW (ref 8.9–10.3)
Chloride: 103 mmol/L (ref 98–111)
Creatinine, Ser: 0.68 mg/dL (ref 0.44–1.00)
GFR, Estimated: >60 mL/min (ref >60)
Glucose, Bld: 95 mg/dL (ref 70–99)
Potassium: 3.5 mmol/L (ref 3.5–5.1)
Sodium: 138 mmol/L (ref 135–145)
Total Bilirubin: 0.3 mg/dL (ref 0.3–1.2)
Total Protein: 6.8 g/dL (ref 6.5–8.1)

## 2021-03-03 NOTE — ED Notes (Signed)
Patient decided to go POV once [facility] got here

## 2021-03-03 NOTE — ED Notes (Signed)
Pt. Talked to Dr. And the Dr. Was ok for patient to come POV

## 2021-03-03 NOTE — Progress Notes (Signed)
Established Patient Office Visit     This visit occurred during the SARS-CoV-2 public health emergency.  Safety protocols were in place, including screening questions prior to the visit, additional usage of staff PPE, and extensive cleaning of exam room while observing appropriate contact time as indicated for disinfecting solutions.    CC/Reason for Visit: ED follow-up   HPI: Nichole Beck is a 50 y.o. female who is coming in today for the above mentioned reasons. Past Medical History is significant for: Hypothyroidism and ongoing tobacco usage.  I saw for the first time in April after an emergency department visit at which time she was diagnosed with hypothyroidism with a TSH of over 100.  We have finally gotten her TSH normalized on a levothyroxine dose of 112 mcg daily.  Throughout this time she has been complaining of dysphagia/compressive symptoms of the throat.  She has referrals in for GI, ENT.  We have also ordered ultrasound of the thyroid and esophagram which are currently pending.  She has had 2 CT scans of the neck and soft tissue in that timeframe that have not showed acute abnormalities other than an acute sinusitis that was treated with Augmentin.  We do not have any documented significant weight loss but she continues to complain of significant dysphagia, sometimes odynophagia but not commonly.  The dysphagia is mostly to solids but at time to liquids as well.  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  She has been in the emergency department twice this weekend due to the symptoms.  ED records have been reviewed in great detail.   Past Medical/Surgical History: Past Medical History:  Diagnosis Date   Abnormal Pap smear of cervix    many yrs ago   Anemia    Hypothyroidism    Nicotine dependence    STD (sexually transmitted disease)    gonorrhea treated    Past Surgical History:  Procedure Laterality Date   BREAST BIOPSY     COLPOSCOPY      TUBAL LIGATION      Social History:  reports that she has been smoking. She has never used smokeless tobacco. She reports previous alcohol use. She reports that she does not use drugs.  Allergies: No Known Allergies  Family History:  Family History  Problem Relation Age of Onset   Cancer Maternal Grandmother    Diabetes Maternal Grandmother    Cancer Paternal Grandmother    Diabetes Paternal Grandmother    Breast cancer Neg Hx      Current Outpatient Medications:    levothyroxine (SYNTHROID) 112 MCG tablet, Take 1 tablet (112 mcg total) by mouth daily., Disp: 90 tablet, Rfl: 1   omeprazole (PRILOSEC) 20 MG capsule, Take 1 capsule (20 mg total) by mouth daily., Disp: 30 capsule, Rfl: 0   Vitamin D, Ergocalciferol, (DRISDOL) 1.25 MG (50000 UNIT) CAPS capsule, Take 1 capsule (50,000 Units total) by mouth every 7 (seven) days for 12 doses. (Patient taking differently: Take 50,000 Units by mouth every 7 (seven) days. thursday), Disp: 12 capsule, Rfl: 0   amoxicillin-clavulanate (AUGMENTIN) 875-125 MG tablet, Take 1 tablet by mouth every 12 (twelve) hours. (Patient not taking: Reported on 03/03/2021), Disp: 14 tablet, Rfl: 0  Review of Systems:  Constitutional: Denies fever, chills, diaphoresis and fatigue.  HEENT: Denies photophobia, eye pain, redness, hearing loss, ear pain, congestion, sore throat, rhinorrhea, sneezing, mouth sores,  neck stiffness and tinnitus.   Respiratory: Denies SOB, DOE,  cough, chest tightness,  and wheezing.   Cardiovascular: Denies chest pain, palpitations and leg swelling.  Gastrointestinal: Denies nausea, vomiting, abdominal pain, diarrhea, constipation, blood in stool and abdominal distention.  Genitourinary: Denies dysuria, urgency, frequency, hematuria, flank pain and difficulty urinating.  Endocrine: Denies: hot or cold intolerance, sweats, changes in hair or nails, polyuria, polydipsia. Musculoskeletal: Denies myalgias, back pain, joint swelling,  arthralgias and gait problem.  Skin: Denies pallor, rash and wound.  Neurological: Denies dizziness, seizures, syncope, weakness, light-headedness, numbness and headaches.  Hematological: Denies adenopathy. Easy bruising, personal or family bleeding history  Psychiatric/Behavioral: Denies suicidal ideation, mood changes, confusion, nervousness, sleep disturbance and agitation    Physical Exam: Vitals:   03/03/21 0816  BP: 102/64  Pulse: 66  Temp: 98.5 F (36.9 C)  TempSrc: Oral  SpO2: 99%  Weight: 151 lb 4.8 oz (68.6 kg)    Body mass index is 24.42 kg/m.   Constitutional: NAD, calm, comfortable Eyes: PERRL, lids and conjunctivae normal ENMT: Mucous membranes are moist. Posterior pharynx clear of any exudate or lesions. Normal dentition. Tympanic membrane is pearly white, no erythema or bulging. Neck: normal, supple, no masses, no thyromegaly  Neurologic: Grossly intact and nonfocal Psychiatric: Normal judgment and insight. Alert and oriented x 3. Normal mood.    Impression and Plan:  Esophageal dysphagia  - Plan: DG ESOPHAGUS W SINGLE CM (SOL OR THIN BA), Ambulatory referral to Gastroenterology -Has a scheduled appointment with ENT, referral to GI has been placed for her dysphagia.  She does not have any reflux/dyspepsia type symptoms. -Barium swallow and thyroid ultrasound are pending.  Hypothyroidism, unspecified type -TSH well-controlled on 112 mcg of levothyroxine daily.  Time spent: 32 minutes reviewing charts, interviewing and examining patient and formulating plan of care.   Patient Instructions  -Nice seeing you today!!  -Work up in process: Referrals to GI, ENT, Endo (at your request). Pending images: thyroid ultrasound and esophagram.     [name], MD [facility]

## 2021-03-03 NOTE — ED Triage Notes (Signed)
Patient reports to the ER for left shoulder pain and tingling in her fingertips. Patient reports this has never happened before

## 2021-03-03 NOTE — Patient Instructions (Signed)
-  Nice seeing you today!!  -Work up in process: Referrals to GI, ENT, Endo (at your request). Pending images: thyroid ultrasound and esophagram.

## 2021-03-03 NOTE — ED Notes (Signed)
Pt in bed, pt states that she is here for some tingling in her L middle and ring finger.  Pt has full range of motion, denies pain, positive sensation to touch.

## 2021-03-03 NOTE — ED Notes (Signed)
Called [facility] to transport patient to [facility] for MRI 

## 2021-03-03 NOTE — ED Provider Notes (Signed)
MEDCENTER [facility] EMERGENCY DEPT Provider Note   CSN: [id number] Arrival date & time: 03/03/21  1449     History Chief Complaint  Patient presents with   Tingling    Nichole Beck is a 50 y.o. female.  The history is provided by the patient and medical records. No language interpreter was used.  Neurologic Problem This is a new problem. The current episode started 3 to 5 hours ago. The problem occurs constantly. The problem has been rapidly improving. Pertinent negatives include no chest pain, no abdominal pain, no headaches and no shortness of breath. Nothing aggravates the symptoms. Nothing relieves the symptoms. She has tried nothing for the symptoms. The treatment provided no relief.      Past Medical History:  Diagnosis Date   Abnormal Pap smear of cervix    many yrs ago   Anemia    Hypothyroidism    Nicotine dependence    STD (sexually transmitted disease)    gonorrhea treated    Patient Active Problem List   Diagnosis Date Noted   Hypothyroidism    Nicotine dependence     Past Surgical History:  Procedure Laterality Date   BREAST BIOPSY     COLPOSCOPY     TUBAL LIGATION       OB History     Gravida  2   Para      Term      Preterm      AB      Living  2      SAB      IAB      Ectopic      Multiple      Live Births              Family History  Problem Relation Age of Onset   Cancer Maternal Grandmother    Diabetes Maternal Grandmother    Cancer Paternal Grandmother    Diabetes Paternal Grandmother    Breast cancer Neg Hx     Social History   Tobacco Use   Smoking status: Every Day   Smokeless tobacco: Never  Substance Use Topics   Alcohol use: Not Currently   Drug use: Never    Home Medications Prior to Admission medications   Medication Sig Start Date End Date Taking? Authorizing Provider  amoxicillin-clavulanate (AUGMENTIN) 875-125 MG tablet Take 1 tablet by mouth every 12 (twelve) hours. Patient not  taking: Reported on 03/03/2021 02/27/21   Margarita Grizzle, MD  levothyroxine (SYNTHROID) 112 MCG tablet Take 1 tablet (112 mcg total) by mouth daily. 02/23/21   Philip Aspen, Limmie Patricia, MD  omeprazole (PRILOSEC) 20 MG capsule Take 1 capsule (20 mg total) by mouth daily. 03/01/21   Horton, Mayer Masker, MD  Vitamin D, Ergocalciferol, (DRISDOL) 1.25 MG (50000 UNIT) CAPS capsule Take 1 capsule (50,000 Units total) by mouth every 7 (seven) days for 12 doses. Patient taking differently: Take 50,000 Units by mouth every 7 (seven) days. thursday 02/23/21 05/12/21  Henderson Cloud, MD    Allergies    Patient has no known allergies.  Review of Systems   Review of Systems  Constitutional:  Negative for chills, fatigue and fever.  HENT:  Positive for trouble swallowing.   Eyes:  Negative for visual disturbance.  Respiratory:  Negative for chest tightness, shortness of breath and wheezing.   Cardiovascular:  Negative for chest pain and palpitations.  Gastrointestinal:  Negative for abdominal pain, constipation, diarrhea, nausea and vomiting.  Genitourinary:  Negative for  dysuria.  Musculoskeletal:  Positive for neck pain. Negative for back pain.  Skin:  Negative for rash and wound.  Neurological:  Positive for numbness. Negative for weakness, light-headedness and headaches.  Psychiatric/Behavioral:  Negative for agitation and confusion.   All other systems reviewed and are negative.  Physical Exam Updated Vital Signs BP (!) 131/92 (BP Location: Right Arm)   Pulse 73   Temp 98.5 F (36.9 C)   Resp 15   LMP  (LMP Unknown)   SpO2 100%   Physical Exam Vitals and nursing note reviewed.  Constitutional:      General: She is not in acute distress.    Appearance: She is well-developed. She is not ill-appearing or diaphoretic.  HENT:     Head: Normocephalic and atraumatic.     Nose: Nose normal. No congestion or rhinorrhea.     Mouth/Throat:     Mouth: Mucous membranes are moist.      Pharynx: No oropharyngeal exudate or posterior oropharyngeal erythema.  Eyes:     Extraocular Movements: Extraocular movements intact.     Conjunctiva/sclera: Conjunctivae normal.     Pupils: Pupils are equal, round, and reactive to light.  Cardiovascular:     Rate and Rhythm: Normal rate and regular rhythm.     Pulses: Normal pulses.     Heart sounds: No murmur heard. Pulmonary:     Effort: Pulmonary effort is normal. No respiratory distress.     Breath sounds: Normal breath sounds. No wheezing or rhonchi.  Chest:     Chest wall: No tenderness.  Abdominal:     General: Abdomen is flat.     Palpations: Abdomen is soft.     Tenderness: There is no abdominal tenderness.  Musculoskeletal:        General: Tenderness present.     Cervical back: Neck supple. Tenderness present.     Right lower leg: No edema.     Left lower leg: No edema.  Skin:    General: Skin is warm and dry.     Capillary Refill: Capillary refill takes less than 2 seconds.     Findings: No erythema or rash.  Neurological:     Mental Status: She is alert.     Cranial Nerves: No cranial nerve deficit.     Sensory: Sensory deficit present.     Motor: No weakness.  Psychiatric:        Mood and Affect: Mood normal.    ED Results / Procedures / Treatments   Labs (all labs ordered are listed, but only abnormal results are displayed) Labs Reviewed  CBC WITH DIFFERENTIAL/PLATELET  ACETYLCHOLINE RECEPTOR AB, ALL  MUSK ANTIBODIES  COMPREHENSIVE METABOLIC PANEL    EKG None  Radiology DG Shoulder Left  Result Date: 03/03/2021 CLINICAL DATA:  Shoulder pain.  Tingling in arm. EXAM: LEFT SHOULDER - 2+ VIEW COMPARISON:  None FINDINGS: There is no evidence of fracture or dislocation. There is no evidence of arthropathy or other focal bone abnormality. Soft tissues are unremarkable. IMPRESSION: Negative. Electronically Signed   By: Tish Frederickson M.D.   On: 03/03/2021 15:59    Procedures Procedures   Medications  Ordered in ED Medications - No data to display  ED Course  I have reviewed the triage vital signs and the nursing notes.  Pertinent labs & imaging results that were available during my care of the patient were reviewed by me and considered in my medical decision making (see chart for details).    MDM Rules/Calculators/A&P  Burnetta Kohls is a 50 y.o. female with a past medical history significant for hypothyroidism who presents with left arm discomfort and tingling numbness.  Patient reports that this afternoon, she started having pain in her left shoulder going towards her left neck and then that pain radiated down towards her fingers with some tingling numbness sensation.  He was not weak.  She denies any trauma and reports the pain is mild to moderate.  Overall, patient is more concerned because of all the different symptoms she is been having over the last few months with dizziness as well as difficulty with swallowing.  She reports she is currently missed of getting work-up with GI in regards to swallow studies and figure out why she is having globus sensation and difficulty swallowing.  Patient reports she does have history of hypothyroidism but denies history of MS or myasthenia gravis.  She denies any fevers, chills, chest pain, shortness of breath, nausea, vomiting, constipation, diarrhea, or urinary symptoms.  Denies any symptoms in the legs.  Denies any new trauma.  Denies any neck manipulation or neck injuries.  On exam today, patient does have tenderness in the left shoulder and in the left neck area.  A Spurling test was positive causing pain to shoot towards her fingers.  There was no weakness but she did have a tingling numbness in her hand.  Right side was unremarkable.  Normal finger-nose-finger testing bilaterally, symmetric smile.  Clear speech.  Pupils symmetric and reactive normal extraocular movements.  Lungs clear and chest nontender.  Abdomen nontender.   Exam otherwise unremarkable.  Clinically I am somewhat concerned given this history of recent neurologic abnormalities with this new left arm symptom.  Her chart shows that other providers have considered things like myasthenia gravis, MS, or other neurologic etiology of her symptoms.  I spoke with Dr. Jerrell Belfast with neurology who agrees that we do need to help rule out MS versus stroke versus myasthenia gravis versus just radicular symptoms going down the arm tonight.  He requested we get a MuSK antibody test, a myasthenia gravis panel, and MRI brain and C-spine with and without contrast to further evaluate. The labs reportedly take several days to return so will be more helpful for outpatient neurology team to follow-up on.  We will do ED to ED transfer for MRI tonight and patient agrees.  If her MRI is reassuring, anticipate discharge with likely muscle relaxant and steroids for the radicular type symptoms with outpatient neurology follow-up.  If concerning findings are discovered, anticipate neurology consultation at [facility].  8:02 PM Spoke with Dr. Silverio Lay who accepts the patient for ED to ED transfer.  Again, anticipate discharge with neurology follow-up if work-up is reassuring with likely prescription for muscle relaxant and steroids if this is just radicular symptoms.  If concerning findings are discovered, anticipate neurology consultation.  8:47 PM Patient expressed concerns about going by [facility] and would rather drive herself.  As the symptoms have been waxing waning, we ideally want her to go by [facility] but she would rather not.  Her airway appears to be intact and she otherwise is well-appearing now.  It is reasonable to let her drive herself.  She will go there for the MRIs and further disposition.  Final Clinical Impression(s) / ED Diagnoses Final diagnoses:  Numbness and tingling in left arm     Clinical Impression: 1. Numbness and tingling in left arm     Disposition: ED to  ED transfer for MRIs  This  note was prepared with assistance of Conservation officer, historic buildingsDragon voice recognition software. Occasional wrong-word or sound-a-like substitutions may have occurred due to the inherent limitations of voice recognition software.     Heywood Tokunaga, Canary Brimhristopher J, MD 03/03/21 [phone]

## 2021-03-04 DIAGNOSIS — R202 Paresthesia of skin: Secondary | ICD-10-CM | POA: Diagnosis not present

## 2021-03-04 MED ORDER — GADOBUTROL 1 MMOL/ML IV SOLN
6.0000 mL | Freq: Once | INTRAVENOUS | Status: AC | PRN
  Administered 2021-03-04: 6 mL via INTRAVENOUS

## 2021-03-04 MED ORDER — METHYLPREDNISOLONE 4 MG PO TBPK: ORAL_TABLET | ORAL | 0 refills | Status: DC

## 2021-03-04 NOTE — ED Provider Notes (Signed)
Patient transferred for MRI of brain and C spine.  She has had left arm tingling and numbness without weakness.  She is also have progressive difficulty swallowing for several days with previous evaluations for same.  Neurology recommended MRIs to assess for MS versus stroke versus myasthenia gravis.  IMPRESSION:  1. Normal MRI of the brain.  2. Small central disc protrusion at C4-5 without spinal canal or  neural foraminal stenosis.   D/w Dr. Amada Jupiter of neurology.  No evidence of stroke or multiple sclerosis. Blood work for myasthenia gravis would not be available for several days.  She will follow-up with neurology as an outpatient. Treat suspected cervical radiculopathy with steroids.  She is not a diabetic. Follow-up with PCP as well as neurologist.  Return precautions discussed   Glynn Octave, MD 03/04/21 [phone]

## 2021-03-04 NOTE — ED Notes (Signed)
The pt returned from mri alert oriented skin warm and dry  No distress

## 2021-03-04 NOTE — Discharge Instructions (Addendum)
Take the steroids as prescribed for a Possible pinched nerve in your neck.  Follow-up with a neurologist for further evaluation as well as the results of your myasthenia gravis blood work.  Today there is no evidence of stroke or multiple sclerosis. Return to the ED with new or worsening symptoms.

## 2021-03-09 ENCOUNTER — Telehealth: Payer: Self-pay | Admitting: Internal Medicine

## 2021-03-09 LAB — ACETYLCHOLINE RECEPTOR AB, ALL
Acety choline binding ab: <0.03 nmol/L (ref 0.00–0.24)
Acetylchol Block Ab: 17 % (ref 0–25)

## 2021-03-09 NOTE — Telephone Encounter (Signed)
Pt call and want a call from dr.Hernandez to talk about light dues at her Job because they are saying her problem is not work related .

## 2021-03-09 NOTE — Telephone Encounter (Signed)
Fyi  Patient wanted to continue work with light duty, but they are requiring FMLA or short term disability.   The forms will be faxed.

## 2021-03-11 LAB — MUSK ANTIBODIES: MuSK Antibodies: <1 U/mL

## 2021-03-17 ENCOUNTER — Telehealth: Payer: Self-pay | Admitting: Internal Medicine

## 2021-03-17 NOTE — Telephone Encounter (Signed)
Patient dropped off paperwork that she would like Dr. Ardyth Harps to fill out.  Patient would like paperwork faxed to [phone] once completed.  Paperwork will be placed in folder.  Please advise.

## 2021-03-18 ENCOUNTER — Encounter: Payer: Self-pay | Admitting: Gastroenterology

## 2021-03-19 NOTE — Telephone Encounter (Signed)
Patient is aware.  Forms faxed and confirmed. Copies made.

## 2021-03-25 ENCOUNTER — Telehealth: Payer: Self-pay | Admitting: Internal Medicine

## 2021-03-25 DIAGNOSIS — R1310 Dysphagia, unspecified: Secondary | ICD-10-CM | POA: Insufficient documentation

## 2021-03-25 NOTE — Telephone Encounter (Signed)
Patient is requesting someone to call her back. She said it is urgent due to her being told that she has to go back to work tomorrow. She also stated that she is still not feeling well.  Patient is requesting a phone callback at [phone].  Please advise.

## 2021-03-25 NOTE — Telephone Encounter (Signed)
Spoke with patient and explained that Dr Ardyth Harps can not write a note to keep her out of work.  Patient became upset and request to speak with a Production designer, theatre/television/film.

## 2021-03-26 ENCOUNTER — Telehealth: Payer: Self-pay | Admitting: Internal Medicine

## 2021-03-26 NOTE — Telephone Encounter (Signed)
Patient is requesting a call back from someone.   Patient can be contacted at [phone].  Please advise.

## 2021-03-29 ENCOUNTER — Ambulatory Visit (HOSPITAL_COMMUNITY)
Admission: RE | Admit: 2021-03-29 | Discharge: 2021-03-29 | Disposition: A | Discharge status: Home / Self Care | Payer: Commercial Managed Care - PPO | Source: Ambulatory Visit | Attending: Internal Medicine | Admitting: Internal Medicine

## 2021-03-29 ENCOUNTER — Telehealth: Payer: Self-pay | Admitting: Internal Medicine

## 2021-03-29 ENCOUNTER — Other Ambulatory Visit: Payer: Self-pay

## 2021-03-29 DIAGNOSIS — R1319 Other dysphagia: Secondary | ICD-10-CM | POA: Diagnosis present

## 2021-03-29 NOTE — Telephone Encounter (Signed)
Pt call and stated she need a work note and want a call back.

## 2021-03-30 NOTE — Telephone Encounter (Signed)
Spoke with patient and explained that Dr Ardyth Harps will not be extending her leave from work.  Patient would like a letter to return to work.  Letter ready for pick up.

## 2021-04-07 ENCOUNTER — Other Ambulatory Visit (INDEPENDENT_AMBULATORY_CARE_PROVIDER_SITE_OTHER): Payer: Commercial Managed Care - PPO

## 2021-04-07 ENCOUNTER — Other Ambulatory Visit: Payer: Self-pay

## 2021-04-07 DIAGNOSIS — E038 Other specified hypothyroidism: Secondary | ICD-10-CM | POA: Diagnosis not present

## 2021-04-07 DIAGNOSIS — E559 Vitamin D deficiency, unspecified: Secondary | ICD-10-CM

## 2021-04-07 LAB — VITAMIN D 25 HYDROXY (VIT D DEFICIENCY, FRACTURES): VITD: 20.54 ng/mL — ABNORMAL LOW (ref 30.00–100.00)

## 2021-04-07 LAB — TSH: TSH: 0.15 u[IU]/mL — ABNORMAL LOW (ref 0.35–5.50)

## 2021-04-08 ENCOUNTER — Other Ambulatory Visit: Payer: Self-pay | Admitting: Internal Medicine

## 2021-04-08 DIAGNOSIS — E559 Vitamin D deficiency, unspecified: Secondary | ICD-10-CM

## 2021-04-08 MED ORDER — VITAMIN D (ERGOCALCIFEROL) 1.25 MG (50000 UNIT) PO CAPS: 50000.0000 [IU] | ORAL_CAPSULE | ORAL | 0 refills | Status: AC

## 2021-04-09 ENCOUNTER — Encounter: Payer: Self-pay | Admitting: Gastroenterology

## 2021-04-09 ENCOUNTER — Other Ambulatory Visit: Payer: Self-pay | Admitting: Internal Medicine

## 2021-04-09 ENCOUNTER — Telehealth: Payer: Self-pay | Admitting: Internal Medicine

## 2021-04-09 ENCOUNTER — Ambulatory Visit (INDEPENDENT_AMBULATORY_CARE_PROVIDER_SITE_OTHER): Payer: Commercial Managed Care - PPO | Admitting: Gastroenterology

## 2021-04-09 VITALS — BP 100/60 | HR 80 | Ht 66.0 in | Wt 150.8 lb

## 2021-04-09 DIAGNOSIS — Z1211 Encounter for screening for malignant neoplasm of colon: Secondary | ICD-10-CM | POA: Diagnosis not present

## 2021-04-09 DIAGNOSIS — R1310 Dysphagia, unspecified: Secondary | ICD-10-CM | POA: Diagnosis not present

## 2021-04-09 DIAGNOSIS — R09A2 Foreign body sensation, throat: Secondary | ICD-10-CM

## 2021-04-09 DIAGNOSIS — R198 Other specified symptoms and signs involving the digestive system and abdomen: Secondary | ICD-10-CM

## 2021-04-09 DIAGNOSIS — E559 Vitamin D deficiency, unspecified: Secondary | ICD-10-CM

## 2021-04-09 DIAGNOSIS — E039 Hypothyroidism, unspecified: Secondary | ICD-10-CM

## 2021-04-09 DIAGNOSIS — R0989 Other specified symptoms and signs involving the circulatory and respiratory systems: Secondary | ICD-10-CM

## 2021-04-09 MED ORDER — LEVOTHYROXINE SODIUM 100 MCG PO TABS: 100.0000 ug | ORAL_TABLET | Freq: Every day | ORAL | 3 refills | Status: DC

## 2021-04-09 MED ORDER — PLENVU 140 G PO SOLR: ORAL | 0 refills | Status: DC

## 2021-04-09 MED ORDER — OMEPRAZOLE 20 MG PO CPDR: 20.0000 mg | DELAYED_RELEASE_CAPSULE | Freq: Every day | ORAL | 1 refills | Status: DC

## 2021-04-09 NOTE — Patient Instructions (Signed)
If you are age 52 or older, your body mass index should be between 23-30. Your Body mass index is 24.34 kg/m. If this is out of the aforementioned range listed, please consider follow up with your Primary Care Provider.  If you are age 26 or younger, your body mass index should be between 19-25. Your Body mass index is 24.34 kg/m. If this is out of the aformentioned range listed, please consider follow up with your Primary Care Provider.   You have been scheduled for an endoscopy and colonoscopy. Please follow the written instructions given to you at your visit today. Please pick up your prep supplies at the pharmacy within the next 1-3 days. If you use inhalers (even only as needed), please bring them with you on the day of your procedure.  The [facility] providers would like to encourage you to use [facility] to communicate with providers for non-urgent requests or questions.  Due to long hold times on the telephone, sending your provider a message by [facility] may be a faster and more efficient way to get a response.  Please allow 48 business hours for a response.  Please remember that this is for non-urgent requests.   It was a pleasure to see you today!  Thank you for trusting me with your gastrointestinal care!    Scott E. Tomasa Rand , MD

## 2021-04-09 NOTE — Telephone Encounter (Signed)
Spoke with patient and reviewed lab results. 

## 2021-04-09 NOTE — Progress Notes (Signed)
HPI : Nichole Beck is a pleasant 50 year old female with hypothyroid who is referred by Dr. Philip Aspen for further evaluation of dysphagia/globus sensation which first started in March of this year.   Patient feels a discomfort in her neck when she swallows.  Sometimes feels like she's 'going to swallow my tongue'.  She says it feels like food sometimes sits in her throat.  She has a sensation that something is in her throat even when she doesn't swallow.   She gets a tightness in her chest with prolonged sitting in the absence of eating. Rare heartburn, no acid regurgitation.  Frequent throat clearing, sometimes brings up thick phlegm.  She was prescribed Prilosec but lost the prescription and so never took it.  The patient was seen in the emergency room on July 29 and August 1 for dysphagia and globus sensation.  A CT of the neck and soft tissues was performed and was unremarkable.  She had a similar presentation in April of this year were a CT was also performed and was normal.  She was referred to GI and ENT at that time.  On August 3 she was again seen in the emergency room for numbness of her upper extremity and she was transferred to another emergency room for expedited MRI which showed a normal MRI of the brain and a small central disc protrusion at C4-5 without significant stenosis.  She was seen by ENT on August 25 and had an unremarkable nasal pharyngoscopy.  On August 29 she underwent a barium esophagram which was normal, with no hiatal hernia, no reflux and normal passage of the barium tablet.  No prior EGD or colonoscopy.  No lower GI symptoms. No family history of GI malignancy   Past Medical History:  Diagnosis Date   Abnormal Pap smear of cervix    many yrs ago   Anemia    Hypothyroidism    Nicotine dependence    STD (sexually transmitted disease)    gonorrhea treated     Past Surgical History:  Procedure Laterality Date   BREAST BIOPSY     COLPOSCOPY     TUBAL  LIGATION     Family History  Problem Relation Age of Onset   Cancer Maternal Grandmother    Diabetes Maternal Grandmother    Cancer Paternal Grandmother    Diabetes Paternal Grandmother    Breast cancer Neg Hx    Social History   Tobacco Use   Smoking status: Every Day   Smokeless tobacco: Never  Substance Use Topics   Alcohol use: Not Currently   Drug use: Never   Current Outpatient Medications  Medication Sig Dispense Refill   levothyroxine (SYNTHROID) 112 MCG tablet Take 1 tablet (112 mcg total) by mouth daily. 90 tablet 1   methylPREDNISolone (MEDROL DOSEPAK) 4 MG TBPK tablet As directed 21 each 0   omeprazole (PRILOSEC) 20 MG capsule Take 1 capsule (20 mg total) by mouth daily. 30 capsule 0   Vitamin D, Ergocalciferol, (DRISDOL) 1.25 MG (50000 UNIT) CAPS capsule Take 1 capsule (50,000 Units total) by mouth every 7 (seven) days for 12 doses. thursday 12 capsule 0   No current facility-administered medications for this visit.   No Known Allergies   Review of Systems: All systems reviewed and negative except where noted in HPI.    DG ESOPHAGUS W DOUBLE CM (HD)  Result Date: 03/29/2021 CLINICAL DATA:  Dysphagia. EXAM: ESOPHOGRAM / BARIUM SWALLOW / BARIUM TABLET STUDY TECHNIQUE: Combined double contrast and  single contrast examination performed using effervescent crystals, thick barium liquid, and thin barium liquid. The patient was observed with fluoroscopy swallowing a 13 mm barium sulphate tablet. FLUOROSCOPY TIME:  Radiation Exposure Index (if provided by the fluoroscopic device): 11.4 mGy. COMPARISON:  None. FINDINGS: No mass or stricture is noted in the esophagus. No hiatal hernia or reflux is noted. Barium tablet passed through esophagus and into stomach without difficulty or delay. IMPRESSION: No definite abnormality seen in the esophagus. Electronically Signed   By: Lupita Raider M.D.   On: 03/29/2021 11:26    Physical Exam: LMP  (LMP Unknown)  Constitutional:  Pleasant,well-developed, African American female in no acute distress. HEENT: Normocephalic and atraumatic. Conjunctivae are normal. No scleral icterus. MP2 Neck supple,, symmetric, no masses/nodules or thyromegaly appreciated  Cardiovascular: Normal rate, regular rhythm.  Pulmonary/chest: Effort normal and breath sounds normal. No wheezing, rales or rhonchi. Abdominal: Soft, nondistended, nontender. Bowel sounds active throughout. There are no masses palpable. No hepatomegaly. Extremities: no edema Lymphadenopathy: No cervical adenopathy noted. Neurological: Alert and oriented to person place and time. Skin: Skin is warm and dry. No rashes noted. Psychiatric: Normal mood and affect. Behavior is normal.  CBC    Component Value Date/Time   WBC 7.4 03/03/2021 2028   RBC 4.87 03/03/2021 2028   HGB 13.2 03/03/2021 2028   HCT 40.6 03/03/2021 2028   PLT 302 03/03/2021 2028   MCV 83.4 03/03/2021 2028   MCH 27.1 03/03/2021 2028   MCHC 32.5 03/03/2021 2028   RDW 14.0 03/03/2021 2028   LYMPHSABS 3.2 03/03/2021 2028   MONOABS 0.5 03/03/2021 2028   EOSABS 0.2 03/03/2021 2028   BASOSABS 0.0 03/03/2021 2028    CMP     Component Value Date/Time   NA 138 03/03/2021 2028   K 3.5 03/03/2021 2028   CL 103 03/03/2021 2028   CO2 27 03/03/2021 2028   GLUCOSE 95 03/03/2021 2028   BUN 14 03/03/2021 2028   CREATININE 0.68 03/03/2021 2028   CALCIUM 8.8 (L) 03/03/2021 2028   PROT 6.8 03/03/2021 2028   ALBUMIN 3.9 03/03/2021 2028   AST 19 03/03/2021 2028   ALT 15 03/03/2021 2028   ALKPHOS 82 03/03/2021 2028   BILITOT 0.3 03/03/2021 2028   GFRNONAA >60 03/03/2021 2028   GFRAA >60 02/23/2020 1109     ASSESSMENT AND PLAN: 50 year old female with 81-month plus history of chronic globus sensation and difficulty swallowing.  She denies a significant history of GERD symptoms.  She has not had a trial of PPI yet.  She had a normal barium swallow, CT of the neck and MRI of the brain.  I have a low  suspicion that her symptoms are related to GERD or an esophageal etiology.  However, it is reasonable to perform an upper endoscopy to exclude eosinophilic esophagitis and evaluate for evidence of GERD.  We will also give her a trial of PPI therapy, Prilosec 20 mg once daily.  If she is not experiencing any improvement after 2 to 3 weeks of this therapy, she can discontinue it.  We discussed what globus sensation is in some of the organic etiologies that can cause it (GERD, postnasal drip, mass lesions).  We also discussed how globus can sometimes be a manifestation of stress or anxiety similar to other chronic GI symptoms.  If EGD negative and PPI not helpful, trial of TCA or SNRI may be considered.   Globus/dysphagia -EGD to r/o EoE eval for e/o GERD -Trial of PPI (Prilosec  20 mg po daily)  CRC screening -Screening colonoscopy  The details, risks (including bleeding, perforation, infection, missed lesions, medication reactions and possible hospitalization or surgery if complications occur), benefits, and alternatives to EGD/colonoscopy with possible biopsy and possible polypectomy were discussed with the patient and she consents to proceed.   Choua Ikner E. Tomasa Rand, MD [facility]   CC: Philip Aspen, [place]*

## 2021-04-09 NOTE — Telephone Encounter (Signed)
Patient is returning a call back.  Patient can be contacted at [phone].  Please advise.

## 2021-04-23 ENCOUNTER — Other Ambulatory Visit: Payer: Self-pay

## 2021-04-23 ENCOUNTER — Ambulatory Visit (INDEPENDENT_AMBULATORY_CARE_PROVIDER_SITE_OTHER): Payer: BLUE CROSS/BLUE SHIELD

## 2021-04-23 DIAGNOSIS — Z23 Encounter for immunization: Secondary | ICD-10-CM

## 2021-05-17 ENCOUNTER — Encounter: Payer: Self-pay | Admitting: Gastroenterology

## 2021-05-17 ENCOUNTER — Ambulatory Visit (AMBULATORY_SURGERY_CENTER): Payer: BLUE CROSS/BLUE SHIELD | Admitting: Gastroenterology

## 2021-05-17 VITALS — BP 113/89 | HR 68 | Temp 98.1°F | Resp 13 | Ht 66.0 in | Wt 150.0 lb

## 2021-05-17 DIAGNOSIS — K449 Diaphragmatic hernia without obstruction or gangrene: Secondary | ICD-10-CM

## 2021-05-17 DIAGNOSIS — Z1211 Encounter for screening for malignant neoplasm of colon: Secondary | ICD-10-CM

## 2021-05-17 DIAGNOSIS — R1310 Dysphagia, unspecified: Secondary | ICD-10-CM

## 2021-05-17 DIAGNOSIS — R0989 Other specified symptoms and signs involving the circulatory and respiratory systems: Secondary | ICD-10-CM

## 2021-05-17 DIAGNOSIS — K2980 Duodenitis without bleeding: Secondary | ICD-10-CM

## 2021-05-17 MED ORDER — SODIUM CHLORIDE 0.9 % IV SOLN: 500.0000 mL | Freq: Once | INTRAVENOUS | Status: AC

## 2021-05-17 NOTE — Progress Notes (Signed)
CHECK-IN-AM ° °V/S-CW °

## 2021-05-17 NOTE — Progress Notes (Signed)
Called to room to assist during endoscopic procedure.  Patient ID and intended procedure confirmed with present staff. Received instructions for my participation in the procedure from the performing physician.  

## 2021-05-17 NOTE — Op Note (Signed)
[facility] Patient Name: Nichole Beck Procedure Date: 05/17/2021 4:17 PM MRN: [id number] Endoscopist: Lorin Picket E. Tomasa Rand , MD Age: 50 Referring MD:  Date of Birth: November 12, 1970 Gender: Female Account #: [id number] Procedure:                Upper GI endoscopy Indications:              Dysphagia, Globus sensation Medicines:                Monitored Anesthesia Care Procedure:                Pre-Anesthesia Assessment:                           - Prior to the procedure, a History and Physical                            was performed, and patient medications and                            allergies were reviewed. The patient's tolerance of                            previous anesthesia was also reviewed. The risks                            and benefits of the procedure and the sedation                            options and risks were discussed with the patient.                            All questions were answered, and informed consent                            was obtained. Prior Anticoagulants: The patient has                            taken no previous anticoagulant or antiplatelet                            agents. ASA Grade Assessment: II - A patient with                            mild systemic disease. After reviewing the risks                            and benefits, the patient was deemed in                            satisfactory condition to undergo the procedure.                           After obtaining informed consent, the endoscope was  passed under direct vision. Throughout the                            procedure, the patient's blood pressure, pulse, and                            oxygen saturations were monitored continuously. The                            GIF [id number] #[id number] was introduced through the                            mouth, and advanced to the third part of duodenum.                            The upper GI endoscopy was  accomplished without                            difficulty. The patient tolerated the procedure                            well. Scope In: Scope Out: Findings:                 The examined portions of the nasopharynx,                            oropharynx and larynx were normal.                           Diffuse mild mucosal changes characterized by                            congestion were found in the middle third of the                            esophagus and in the lower third of the esophagus.                            Biopsies were obtained from the proximal and distal                            esophagus with cold forceps for histology of                            suspected eosinophilic esophagitis. Estimated blood                            loss was minimal.                           The exam of the esophagus was otherwise normal.                           A 4 cm hiatal hernia was present.  The exam of the stomach was otherwise normal.                           Patchy mildly erythematous mucosa without active                            bleeding and with no stigmata of bleeding was found                            in the duodenal bulb and in the second portion of                            the duodenum. Biopsies were taken with a cold                            forceps for histology. Estimated blood loss was                            minimal.                           The exam of the duodenum was otherwise normal. Complications:            No immediate complications. Estimated Blood Loss:     Estimated blood loss was minimal. Impression:               - The examined portions of the nasopharynx,                            oropharynx and larynx were normal.                           - Congested mucosa in the esophagus. Biopsied.                           - 4 cm hiatal hernia.                           - Erythematous duodenopathy. Biopsied.                            - Given hiatal hernia and improvement in symptoms                            with PPI, globus sensation and dysphagia are most                            likely reflux related. Recommendation:           - Patient has a contact number available for                            emergencies. The signs and symptoms of potential                            delayed  complications were discussed with the                            patient. Return to normal activities tomorrow.                            Written discharge instructions were provided to the                            patient.                           - Resume previous diet.                           - Continue present medications.                           - Await pathology results.                           - Continue omeprazole.                           - Follow up as needed in GI clinic. Cameran Ahmed E. Tomasa Rand, MD 05/17/2021 5:00:06 PM This report has been signed electronically.

## 2021-05-17 NOTE — Progress Notes (Signed)
To pacu, VSS. Report to [email or web address] 

## 2021-05-17 NOTE — Op Note (Signed)
[facility] Patient Name: Nichole Beck Procedure Date: 05/17/2021 4:10 PM MRN: [id number] Endoscopist: Lorin Picket E. Tomasa Rand , MD Age: 50 Referring MD:  Date of Birth: 1971/01/29 Gender: Female Account #: [id number] Procedure:                Colonoscopy Indications:              Screening for colorectal malignant neoplasm, This                            is the patient's first colonoscopy Medicines:                Monitored Anesthesia Care Procedure:                Pre-Anesthesia Assessment:                           - Prior to the procedure, a History and Physical                            was performed, and patient medications and                            allergies were reviewed. The patient's tolerance of                            previous anesthesia was also reviewed. The risks                            and benefits of the procedure and the sedation                            options and risks were discussed with the patient.                            All questions were answered, and informed consent                            was obtained. Prior Anticoagulants: The patient has                            taken no previous anticoagulant or antiplatelet                            agents. ASA Grade Assessment: II - A patient with                            mild systemic disease. After reviewing the risks                            and benefits, the patient was deemed in                            satisfactory condition to undergo the procedure.  After obtaining informed consent, the colonoscope                            was passed under direct vision. Throughout the                            procedure, the patient's blood pressure, pulse, and                            oxygen saturations were monitored continuously. The                            CF [id number] #[id number] was introduced through the anus                            and advanced to the  the cecum, identified by                            appendiceal orifice and ileocecal valve. The                            colonoscopy was performed without difficulty. The                            patient tolerated the procedure well. The quality                            of the bowel preparation was good. The ileocecal                            valve, appendiceal orifice, and rectum were                            photographed. Scope In: 4:44:03 PM Scope Out: 4:54:18 PM Scope Withdrawal Time: 0 hours 5 minutes 51 seconds  Total Procedure Duration: 0 hours 10 minutes 15 seconds  Findings:                 The perianal and digital rectal examinations were                            normal. Pertinent negatives include normal                            sphincter tone and no palpable rectal lesions.                           The colon (entire examined portion) appeared normal.                           The retroflexed view of the distal rectum and anal                            verge was normal and showed no anal or rectal  abnormalities. Complications:            No immediate complications. Estimated Blood Loss:     Estimated blood loss: none. Impression:               - The entire examined colon is normal.                           - The distal rectum and anal verge are normal on                            retroflexion view.                           - No specimens collected. Recommendation:           - Patient has a contact number available for                            emergencies. The signs and symptoms of potential                            delayed complications were discussed with the                            patient. Return to normal activities tomorrow.                            Written discharge instructions were provided to the                            patient.                           - Resume previous diet.                           -  Continue present medications.                           - Repeat colonoscopy in 10 years for screening                            purposes. Juniper Cobey E. Tomasa Rand, MD 05/17/2021 5:02:30 PM This report has been signed electronically.

## 2021-05-17 NOTE — Progress Notes (Signed)
[facility] History and Physical   Primary Care Physician:  Philip Aspen, Limmie Patricia, MD   Reason for Procedure:   Dysphagia, globus sensation, Colon cancer screening  Plan:    EGD and colonoscopy     HPI: Nichole Beck is a 50 y.o. female with persistent symptoms of globus sensation and dysphagia.  No typical GERD symptoms.  She is also undergoing initial average risk screening colonoscopy.   Past Medical History:  Diagnosis Date   Abnormal Pap smear of cervix    many yrs ago   Anemia    Hypothyroidism    Nicotine dependence    STD (sexually transmitted disease)    gonorrhea treated    Past Surgical History:  Procedure Laterality Date   BREAST BIOPSY     COLPOSCOPY     TUBAL LIGATION      Prior to Admission medications   Medication Sig Start Date End Date Taking? Authorizing Provider  levothyroxine (SYNTHROID) 100 MCG tablet Take 1 tablet (100 mcg total) by mouth daily. 04/09/21  Yes Philip Aspen, Limmie Patricia, MD  omeprazole (PRILOSEC) 20 MG capsule Take 1 capsule (20 mg total) by mouth daily. 04/09/21  Yes Jenel Lucks, MD  Vitamin D, Ergocalciferol, (DRISDOL) 1.25 MG (50000 UNIT) CAPS capsule Take 1 capsule (50,000 Units total) by mouth every 7 (seven) days for 12 doses. thursday 04/08/21 06/25/21  Henderson Cloud, MD    Current Outpatient Medications  Medication Sig Dispense Refill   levothyroxine (SYNTHROID) 100 MCG tablet Take 1 tablet (100 mcg total) by mouth daily. 90 tablet 3   omeprazole (PRILOSEC) 20 MG capsule Take 1 capsule (20 mg total) by mouth daily. 30 capsule 1   Vitamin D, Ergocalciferol, (DRISDOL) 1.25 MG (50000 UNIT) CAPS capsule Take 1 capsule (50,000 Units total) by mouth every 7 (seven) days for 12 doses. thursday 12 capsule 0   Current Facility-Administered Medications  Medication Dose Route Frequency Provider Last Rate Last Admin   0.9 %  sodium chloride infusion  500 mL Intravenous Once Jenel Lucks, MD         Allergies as of 05/17/2021   (No Known Allergies)    Family History  Problem Relation Age of Onset   Cancer Maternal Grandmother    Diabetes Maternal Grandmother    Cancer Paternal Grandmother    Diabetes Paternal Grandmother    Breast cancer Neg Hx    Colon polyps Neg Hx    Colon cancer Neg Hx    Esophageal cancer Neg Hx    Rectal cancer Neg Hx    Stomach cancer Neg Hx     Social History   Socioeconomic History   Marital status: Single    Spouse name: Not on file   Number of children: Not on file   Years of education: Not on file   Highest education level: Not on file  Occupational History   Not on file  Tobacco Use   Smoking status: Every Day   Smokeless tobacco: Never  Vaping Use   Vaping Use: Never used  Substance and Sexual Activity   Alcohol use: Not Currently   Drug use: Never   Sexual activity: Not Currently    Birth control/protection: Post-menopausal  Other Topics Concern   Not on file  Social History Narrative   Not on file   Social Determinants of Health   Financial Resource Strain: Not on file  Food Insecurity: Not on file  Transportation Needs: Not on file  Physical Activity:  Not on file  Stress: Not on file  Social Connections: Not on file  Intimate Partner Violence: Not on file    Review of Systems:  All other review of systems negative except as mentioned in the HPI.  Physical Exam: Vital signs BP (!) 136/97 (Patient Position: Sitting)   Pulse 73   Temp 98.1 F (36.7 C)   Ht 5\' 6"  (1.676 m)   Wt 150 lb (68 kg)   LMP  (LMP Unknown)   SpO2 98%   BMI 24.21 kg/m   General:   Alert,  Well-developed, well-nourished, pleasant and cooperative in NAD Lungs:  Clear throughout to auscultation.   Heart:  Regular rate and rhythm; no murmurs, clicks, rubs,  or gallops. Abdomen:  Soft, nontender and nondistended. Normal bowel sounds.   Neuro/Psych:  Normal mood and affect. A and O x 3   Maui Ahart E. , MD [facility]  Gastroenterology

## 2021-05-17 NOTE — Patient Instructions (Signed)
Information on hiatal hernias given to you today.  Await pathology results.  Continue on all your present medications, including Omeprazole.  Resume regular diet.  Repeat colonoscopy in 10 years.   YOU HAD AN ENDOSCOPIC PROCEDURE TODAY AT THE [facility]:   Refer to the procedure report that was given to you for any specific questions about what was found during the examination.  If the procedure report does not answer your questions, please call your gastroenterologist to clarify.  If you requested that your care partner not be given the details of your procedure findings, then the procedure report has been included in a sealed envelope for you to review at your convenience later.  YOU SHOULD EXPECT: Some feelings of bloating in the abdomen. Passage of more gas than usual.  Walking can help get rid of the air that was put into your GI tract during the procedure and reduce the bloating. If you had a lower endoscopy (such as a colonoscopy or flexible sigmoidoscopy) you may notice spotting of blood in your stool or on the toilet paper. If you underwent a bowel prep for your procedure, you may not have a normal bowel movement for a few days.  Please Note:  You might notice some irritation and congestion in your nose or some drainage.  This is from the oxygen used during your procedure.  There is no need for concern and it should clear up in a day or so.  SYMPTOMS TO REPORT IMMEDIATELY:  Following lower endoscopy (colonoscopy or flexible sigmoidoscopy):  Excessive amounts of blood in the stool  Significant tenderness or worsening of abdominal pains  Swelling of the abdomen that is new, acute  Fever of 100F or higher  Following upper endoscopy (EGD)  Vomiting of blood or coffee ground material  New chest pain or pain under the shoulder blades  Painful or persistently difficult swallowing  New shortness of breath  Fever of 100F or higher  Black, tarry-looking stools  For  urgent or emergent issues, a gastroenterologist can be reached at any hour by calling [phone]. Do not use MyChart messaging for urgent concerns.    DIET:  We do recommend a small meal at first, but then you may proceed to your regular diet.  Drink plenty of fluids but you should avoid alcoholic beverages for 24 hours.  ACTIVITY:  You should plan to take it easy for the rest of today and you should NOT DRIVE or use heavy machinery until tomorrow (because of the sedation medicines used during the test).    FOLLOW UP: Our staff will call the number listed on your records 48-72 hours following your procedure to check on you and address any questions or concerns that you may have regarding the information given to you following your procedure. If we do not reach you, we will leave a message.  We will attempt to reach you two times.  During this call, we will ask if you have developed any symptoms of COVID 19. If you develop any symptoms (ie: fever, flu-like symptoms, shortness of breath, cough etc.) before then, please call [phone].  If you test positive for Covid 19 in the 2 weeks post procedure, please call and report this information to [place].    If any biopsies were taken you will be contacted by phone or by letter within the next 1-3 weeks.  Please call us at [phone] if you have not heard about the biopsies in 3 weeks.  SIGNATURES/CONFIDENTIALITY: You and/or your care partner have signed paperwork which will be entered into your electronic medical record.  These signatures attest to the fact that that the information above on your After Visit Summary has been reviewed and is understood.  Full responsibility of the confidentiality of this discharge information lies with you and/or your care-partner.

## 2021-05-19 ENCOUNTER — Other Ambulatory Visit: Payer: Self-pay | Admitting: Internal Medicine

## 2021-05-19 ENCOUNTER — Other Ambulatory Visit: Payer: Self-pay

## 2021-05-19 ENCOUNTER — Other Ambulatory Visit: Payer: BLUE CROSS/BLUE SHIELD

## 2021-05-19 ENCOUNTER — Ambulatory Visit: Payer: BLUE CROSS/BLUE SHIELD | Admitting: Internal Medicine

## 2021-05-19 ENCOUNTER — Telehealth: Payer: Self-pay | Admitting: *Deleted

## 2021-05-19 ENCOUNTER — Telehealth: Payer: Self-pay

## 2021-05-19 DIAGNOSIS — R0989 Other specified symptoms and signs involving the circulatory and respiratory systems: Secondary | ICD-10-CM

## 2021-05-19 DIAGNOSIS — E039 Hypothyroidism, unspecified: Secondary | ICD-10-CM

## 2021-05-19 LAB — BASIC METABOLIC PANEL
BUN: 8 mg/dL (ref 6–23)
CO2: 28 mEq/L (ref 19–32)
Calcium: 9.3 mg/dL (ref 8.4–10.5)
Chloride: 104 mEq/L (ref 96–112)
Creatinine, Ser: 0.73 mg/dL (ref 0.40–1.20)
GFR: 94.41 mL/min (ref >60.00)
Glucose, Bld: 85 mg/dL (ref 70–99)
Potassium: 3.1 mEq/L — ABNORMAL LOW (ref 3.5–5.1)
Sodium: 141 mEq/L (ref 135–145)

## 2021-05-19 MED ORDER — LIDOCAINE VISCOUS HCL 2 % MT SOLN: OROMUCOSAL | 0 refills | Status: DC

## 2021-05-19 MED ORDER — LIDOCAINE VISCOUS HCL 2 % MT SOLN
15.0000 mL | OROMUCOSAL | 0 refills | Status: DC | PRN
Start: 2021-05-19 — End: 2021-05-19

## 2021-05-19 NOTE — Telephone Encounter (Signed)
Left message on follow up call. 

## 2021-05-19 NOTE — Addendum Note (Signed)
Addended by: Karl Bales B on: 05/19/2021 02:48 PM   Modules accepted: Orders

## 2021-05-19 NOTE — Telephone Encounter (Signed)
  Follow up Call-  Call back number 05/17/2021  Post procedure Call Back phone  # [phone]  Permission to leave phone message Yes     Patient questions:  Do you have a fever, pain , or abdominal swelling? Yes.   Pain Score  5 *  Have you tolerated food without any problems? Yes.    Have you been able to return to your normal activities? Yes.    Do you have any questions about your discharge instructions: Diet   No. Medications  No. Follow up visit  No.  Do you have questions or concerns about your Care? No.  Actions: * If pain score is 4 or above: No action needed, pain <4.  Have you developed a fever since your procedure? no  2.   Have you had an respiratory symptoms (SOB or cough) since your procedure? no  3.   Have you tested positive for COVID 19 since your procedure no  4.   Have you had any family members/close contacts diagnosed with the COVID 19 since your procedure?  no   If yes to any of these questions please route to Laverna Peace, RN and Karlton Lemon, RN   Dr. Tomasa Rand, This pt is c/o "throat pain every time I swallow," rating as a "5."  She states "I didn't have this pain before but it feels like my throat is squeezing when I eat."  No other issues- denies SOB.  She has been able to eat and drink.  Please advise.  Thanks, [employer]

## 2021-05-19 NOTE — Telephone Encounter (Signed)
Per Dr. Tomasa Rand, prescribe 100 mL, 0 refills  RX sent to pharmacy and pt is aware

## 2021-05-19 NOTE — Telephone Encounter (Signed)
Dr. Tomasa Rand,  How many mLs would you like prescribed?  Any refills?  Thanks, [employer]

## 2021-05-25 ENCOUNTER — Encounter: Payer: Self-pay | Admitting: Internal Medicine

## 2021-05-25 ENCOUNTER — Encounter: Payer: Self-pay | Admitting: Gastroenterology

## 2021-05-25 ENCOUNTER — Other Ambulatory Visit: Payer: Self-pay

## 2021-05-25 ENCOUNTER — Ambulatory Visit
Admission: RE | Admit: 2021-05-25 | Discharge: 2021-05-25 | Disposition: A | Discharge status: Home / Self Care | Payer: BLUE CROSS/BLUE SHIELD | Source: Ambulatory Visit | Attending: Internal Medicine | Admitting: Internal Medicine

## 2021-05-25 DIAGNOSIS — E039 Hypothyroidism, unspecified: Secondary | ICD-10-CM

## 2021-05-25 DIAGNOSIS — E042 Nontoxic multinodular goiter: Secondary | ICD-10-CM | POA: Insufficient documentation

## 2021-05-27 ENCOUNTER — Telehealth: Payer: Self-pay | Admitting: *Deleted

## 2021-05-27 NOTE — Telephone Encounter (Signed)
Patient is aware of the thyroid ultrasound.  She is still having difficulty swallowing and would like to know what to do next?

## 2021-05-27 NOTE — Telephone Encounter (Signed)
Left detailed message on machine for patient with Dr Hernandez's recommendation. 

## 2021-05-31 ENCOUNTER — Encounter: Payer: Self-pay | Admitting: Neurology

## 2021-05-31 ENCOUNTER — Ambulatory Visit: Payer: BLUE CROSS/BLUE SHIELD | Admitting: Neurology

## 2021-05-31 VITALS — BP 128/90 | HR 71 | Ht 67.0 in | Wt 150.6 lb

## 2021-05-31 DIAGNOSIS — M79642 Pain in left hand: Secondary | ICD-10-CM

## 2021-05-31 DIAGNOSIS — M79641 Pain in right hand: Secondary | ICD-10-CM | POA: Diagnosis not present

## 2021-05-31 NOTE — Patient Instructions (Addendum)
  Carpal Tunnel Syndrome  Carpal tunnel syndrome is a condition that causes pain, numbness, and weakness in your hand and fingers. The carpal tunnel is a narrow area located on the palm side of your wrist. Repeated wrist motion or certain diseases may cause swelling within the tunnel. This swelling pinches the main nerve in the wrist.The main nerve in the wrist is called the median nerve. What are the causes? This condition may be caused by: Repeated and forceful wrist and hand motions. Wrist injuries. Arthritis. A cyst or tumor in the carpal tunnel. Fluid buildup during pregnancy. Use of tools that vibrate. Sometimes the cause of this condition is not known. What increases the risk? The following factors may make you more likely to develop this condition: Having a job that requires you to repeatedly or forcefully move your wrist or hand or requires you to use tools that vibrate. This may include jobs that involve using computers, working on an assembly line, or working with power tools such as drills or sanders. Being a woman. Having certain conditions, such as: Diabetes. Obesity. An underactive thyroid (hypothyroidism). Kidney failure. Rheumatoid arthritis. What are the signs or symptoms? Symptoms of this condition include: A tingling feeling in your fingers, especially in your thumb, index, and middle fingers. Tingling or numbness in your hand. An aching feeling in your entire arm, especially when your wrist and elbow are bent for a long time. Wrist pain that goes up your arm to your shoulder. Pain that goes down into your palm or fingers. A weak feeling in your hands. You may have trouble grabbing and holding items. Your symptoms may feel worse during the night. How is this diagnosed? This condition is diagnosed with a medical history and physical exam. You may also have tests, including: Electromyogram (EMG). This test measures electrical signals sent by your nerves into the  muscles. Nerve conduction study. This test measures how well electrical signals pass through your nerves. Imaging tests, such as X-rays, ultrasound, and MRI. These tests check for possible causes of your condition. How is this treated? This condition may be treated with: Lifestyle changes. It is important to stop or change the activity that caused your condition. Doing exercise and activities to strengthen and stretch your muscles and tendons (physical therapy). Making lifestyle changes to help with your condition and learning how to do your daily activities safely (occupational therapy). Medicines for pain and inflammation. This may include medicine that is injected into your wrist. A wrist splint or brace. Surgery. Follow these instructions at home: If you have a splint or brace: Wear the splint or brace as told by your health care provider. Remove it only as told by your health care provider. Loosen the splint or brace if your fingers tingle, become numb, or turn cold and blue. Keep the splint or brace clean. If the splint or brace is not waterproof: Do not let it get wet. Cover it with a watertight covering when you take a bath or shower. Managing pain, stiffness, and swelling If directed, put ice on the painful area. To do this: If you have a removeable splint or brace, remove it as told by your health care provider. Put ice in a plastic bag. Place a towel between your skin and the bag or between the splint or brace and the bag. Leave the ice on for 20 minutes, 2-3 times a day. Do not fall asleep with the cold pack on your skin. Remove the ice if your skin   turns bright red. This is very important. If you cannot feel pain, heat, or cold, you have a greater risk of damage to the area. Move your fingers often to reduce stiffness and swelling. General instructions Take over-the-counter and prescription medicines only as told by your health care provider. Rest your wrist and hand from  any activity that may be causing your pain. If your condition is work related, talk with your employer about changes that can be made, such as getting a wrist pad to use while typing. Do any exercises as told by your health care provider, physical therapist, or occupational therapist. Keep all follow-up visits. This is important. Contact a health care provider if: You have new symptoms. Your pain is not controlled with medicines. Your symptoms get worse. Get help right away if: You have severe numbness or tingling in your wrist or hand. Summary Carpal tunnel syndrome is a condition that causes pain, numbness, and weakness in your hand and fingers. It is usually caused by repeated wrist motions. Lifestyle changes and medicines are used to treat carpal tunnel syndrome. Surgery may be recommended. Follow your health care provider's instructions about wearing a splint, resting from activity, keeping follow-up visits, and calling for help. This information is not intended to replace advice given to you by your health care provider. Make sure you discuss any questions you have with your healthcare provider. Document Revised: 11/28/2019 Document Reviewed: 11/28/2019 Elsevier Patient Education  2022 [employer]  Electromyoneurogram Electromyoneurogram is a test to check how well your muscles and nerves are working. This procedure includes the combined use of electromyogram (EMG) and nerve conduction study (NCS). EMG is used to look for muscular disorders. NCS, which is also called electroneurogram, measures how well your nerves arecontrolling your muscles. The procedures are usually done together to check if your muscles and nerves are healthy. If the results of the tests are abnormal, this may indicatedisease or injury, such as a neuromuscular disease or peripheral nerve damage. Tell a health care provider about: Any allergies you have. All medicines you are taking, including vitamins, herbs, eye  drops, creams, and over-the-counter medicines. Any problems you or family members have had with anesthetic medicines. Any blood disorders you have. Any surgeries you have had. Any medical conditions you have. If you have a pacemaker. Whether you are pregnant or may be pregnant. What are the risks? Generally, this is a safe procedure. However, problems may occur, including: Infection where the electrodes were inserted. Bleeding. What happens before the procedure? Medicines Ask your health care provider about: Changing or stopping your regular medicines. This is especially important if you are taking diabetes medicines or blood thinners. Taking medicines such as aspirin and ibuprofen. These medicines can thin your blood. Do not take these medicines unless your health care provider tells you to take them. Taking over-the-counter medicines, vitamins, herbs, and supplements. General instructions Your health care provider may ask you to avoid: Beverages that have caffeine, such as coffee and tea. Any products that contain nicotine or tobacco. These products include cigarettes, e-cigarettes, and chewing tobacco. If you need help quitting, ask your health care provider. Do not use lotions or creams on the same day that you will be having the procedure. What happens during the procedure? For EMG  Your health care provider will ask you to stay in a position so that he or she can access the muscle that will be studied. You may be standing, sitting, or lying down. You may be given a   medicine that numbs the area (local anesthetic). A very thin needle that has an electrode will be inserted into your muscle. Another small electrode will be placed on your skin near the muscle. Your health care provider will ask you to continue to remain still. The electrodes will send a signal that tells about the electrical activity of your muscles. You may see this on a monitor or hear it in the room. After your  muscles have been studied at rest, your health care provider will ask you to contract or flex your muscles. The electrodes will send a signal that tells about the electrical activity of your muscles. Your health care provider will remove the electrodes and the electrode needles when the procedure is finished. The procedure may vary among health care providers and hospitals. For NCS  An electrode that records your nerve activity (recording electrode) will be placed on your skin by the muscle that is being studied. An electrode that is used as a reference (reference electrode) will be placed near the recording electrode. A paste or gel will be applied to your skin between the recording electrode and the reference electrode. Your nerve will be stimulated with a mild shock. Your health care provider will measure how much time it takes for your muscle to react. Your health care provider will remove the electrodes and the gel when the procedure is finished. The procedure may vary among health care providers and hospitals. What happens after the procedure? It is up to you to get the results of your procedure. Ask your health care provider, or the department that is doing the procedure, when your results will be ready. Your health care provider may: Give you medicines for any pain. Monitor the insertion sites to make sure that bleeding stops. Summary Electromyoneurogram is a test to check how well your muscles and nerves are working. If the results of the tests are abnormal, this may indicate disease or injury. This is a safe procedure. However, problems may occur, such as bleeding and infection. Your health care provider will do two tests to complete this procedure. One checks your muscles (EMG) and another checks your nerves (NCS). It is up to you to get the results of your procedure. Ask your health care provider, or the department that is doing the procedure, when your results will be ready. This  information is not intended to replace advice given to you by your health care provider. Make sure you discuss any questions you have with your healthcare provider. Document Revised: 04/03/2018 Document Reviewed: 03/16/2018 Elsevier Patient Education  2022 Elsevier Inc.  

## 2021-05-31 NOTE — Progress Notes (Addendum)
[facility]    Provider:  Dr Lucia Gaskins Requesting Provider: Glynn Octave, MD Primary Care Provider:  Philip Aspen, Limmie Patricia, MD  CC:  bilateral hand pain  HPI:  Nichole Beck is a 50 y.o. female here as requested by Glynn Octave, MD for numbness and tingling in the left arm. PMHx anemia, hypothyroidism, nicotine dependence,STD.  Her arm symptoms ongoing at least a year off and on, tingling in the fingers, mostly digits 3-4, wakes her up at night with numbness, moving it around helps, she has pain in the thumb and the wrist, worse when driving and she turns it, she has some neck pain but no radicular symptoms, she gets sharp pains radiating up the forearm from the ventral wrist, weakness of grip or opening bottles, no radicular symptoms from the neck to the hands, has some local neck pain and some radicular symptoms into the shoulders and stiffness in the neck. Left worse than the right. Has cramps as well.No other focal neurologic deficits, associated symptoms, inciting events or modifiable factors.  Reviewed notes, labs and imaging from outside physicians, which showed:  02/19/2021: hgba1c 5.9  I reviewed Dr. Randel Books notes: Patient was seen in the emergency room, she had left arm tingling and numbness without weakness, also progressive difficulty swallowing for several days with previous evaluations for the same, neurology was consulted and recommended MRIs, MRI of the brain and cervical spine were both essentially normal, MRI of the brain was normal, MRI of the cervical spine only showed a small central disc protrusion at C4-C5 without spinal canal or neuroforaminal stenosis, no evidence of stroke or multiple sclerosis, she was asked to follow-up with neurology as outpatient, they treated her with steroids for suspected cervical radiculopathy.  She was also seen by [facility] gastroenterology Dr. Tomasa Rand for dysphagia globus sensation September 2022, and discomfort in  her neck when she swallows, has a sensation something is in her throat with frequent throat clearing and thick phlegm, CT of the neck soft tissue was performed and was unremarkable, she has had similar presentations this year in April and the CT at that time was also normal, she was referred to GI and ENT at that time, she was seen by ENT in August 25 and had unremarkable nasal pharyngoscopy.  On August 29 she underwent a barium esophagram which was normal, no hiatal hernia, no reflux and normal passage of the barium tablet.  An upper endoscopy was ordered and a trial of PPI therapy.  She has also had a thyroid ultrasound.  FINDINGS: personally reviewed images MRI HEAD FINDINGS   Brain: No acute infarct, mass effect or extra-axial collection. No acute or chronic hemorrhage. Normal white matter signal, parenchymal volume and CSF spaces. The midline structures are normal. There is no abnormal contrast enhancement.   Vascular: Major flow voids are preserved.    Skull and upper cervical spine: Normal calvarium and skull base. Visualized upper cervical spine and soft tissues are normal.   Sinuses/Orbits:No paranasal sinus fluid levels or advanced mucosal thickening. No mastoid or middle ear effusion. Normal orbits.   MRI CERVICAL SPINE FINDINGS   Alignment: Physiologic.   Vertebrae: No fracture, evidence of discitis, or bone lesion.   Cord: Normal signal and morphology.   Posterior Fossa, vertebral arteries, paraspinal tissues: Negative.   Disc levels:   C1-2: Unremarkable.   C2-3: Normal disc space and facet joints. There is no spinal canal stenosis. No neural foraminal stenosis.   C3-4: Normal disc space and facet joints. There  is no spinal canal stenosis. No neural foraminal stenosis.   C4-5: Small central disc protrusion. There is no spinal canal stenosis. No neural foraminal stenosis.   C5-6: Normal disc space and facet joints. There is no spinal canal stenosis. No neural  foraminal stenosis.   C6-7: Normal disc space and facet joints. There is no spinal canal stenosis. No neural foraminal stenosis.   C7-T1: Normal disc space and facet joints. There is no spinal canal stenosis. No neural foraminal stenosis.   IMPRESSION: 1. Normal MRI of the brain. 2. Small central disc protrusion at C4-5 without spinal canal or neural foraminal stenosis.    Review of Systems: Patient complains of symptoms per HPI as well as the following symptoms hand pain. Pertinent negatives and positives per HPI. All others negative.   Social History   Socioeconomic History   Marital status: Single    Spouse name: Not on file   Number of children: Not on file   Years of education: Not on file   Highest education level: Not on file  Occupational History   Not on file  Tobacco Use   Smoking status: Every Day    Packs/day: 0.50    Types: Cigarettes   Smokeless tobacco: Never  Vaping Use   Vaping Use: Never used  Substance and Sexual Activity   Alcohol use: Not Currently    Comment: occ   Drug use: Never   Sexual activity: Not Currently    Birth control/protection: Post-menopausal  Other Topics Concern   Not on file  Social History Narrative   Not on file   Social Determinants of Health   Financial Resource Strain: Not on file  Food Insecurity: Not on file  Transportation Needs: Not on file  Physical Activity: Not on file  Stress: Not on file  Social Connections: Not on file  Intimate Partner Violence: Not on file    Family History  Problem Relation Age of Onset   Cancer Maternal Grandmother    Diabetes Maternal Grandmother    Cancer Paternal Grandmother    Diabetes Paternal Grandmother    Breast cancer Neg Hx    Colon polyps Neg Hx    Colon cancer Neg Hx    Esophageal cancer Neg Hx    Rectal cancer Neg Hx    Stomach cancer Neg Hx     Past Medical History:  Diagnosis Date   Abnormal Pap smear of cervix    many yrs ago   Anemia     Hypothyroidism    Nicotine dependence    STD (sexually transmitted disease)    gonorrhea treated    Patient Active Problem List   Diagnosis Date Noted   Bilateral hand pain 05/31/2021   Multiple thyroid nodules 05/25/2021   Hypothyroidism    Nicotine dependence     Past Surgical History:  Procedure Laterality Date   BREAST BIOPSY     COLPOSCOPY     TUBAL LIGATION      Current Outpatient Medications  Medication Sig Dispense Refill   levothyroxine (SYNTHROID) 100 MCG tablet Take 1 tablet (100 mcg total) by mouth daily. 90 tablet 3   omeprazole (PRILOSEC) 20 MG capsule Take 1 capsule (20 mg total) by mouth daily. 30 capsule 1   Vitamin D, Ergocalciferol, (DRISDOL) 1.25 MG (50000 UNIT) CAPS capsule Take 1 capsule (50,000 Units total) by mouth every 7 (seven) days for 12 doses. thursday 12 capsule 0   Current Facility-Administered Medications  Medication Dose Route Frequency Provider Last Rate  Last Admin   0.9 %  sodium chloride infusion  500 mL Intravenous Once Jenel Lucks, MD        Allergies as of 05/31/2021   (No Known Allergies)    Vitals: BP 128/90   Pulse 71   Ht 5\' 7"  (1.702 m)   Wt 150 lb 9.6 oz (68.3 kg)   LMP  (LMP Unknown)   BMI 23.59 kg/m  Last Weight:  Wt Readings from Last 1 Encounters:  05/31/21 150 lb 9.6 oz (68.3 kg)   Last Height:   Ht Readings from Last 1 Encounters:  05/31/21 5\' 7"  (1.702 m)     Physical exam: Exam: Gen: NAD, conversant, well nourised,  well groomed                     CV: RRR, no MRG. No Carotid Bruits. No peripheral edema, warm, nontender Eyes: Conjunctivae clear without exudates or hemorrhage  Neuro: Detailed Neurologic Exam  Speech:    Speech is normal; fluent and spontaneous with normal comprehension.  Cognition:    The patient is oriented to person, place, and time;     recent and remote memory intact;     language fluent;     normal attention, concentration,     fund of knowledge Cranial Nerves:     The pupils are equal, round, and reactive to light. Pupils too small to visualize fundi Visual fields are full to finger confrontation. Extraocular movements are intact. Trigeminal sensation is intact and the muscles of mastication are normal. The face is symmetric. The palate elevates in the midline. Hearing intact. Voice is normal. Shoulder shrug is normal. The tongue has normal motion without fasciculations.   Coordination:    Normal   Gait:  normal.   Motor Observation:    No asymmetry, no atrophy, and no involuntary movements noted. Tone:    Normal muscle tone.    Posture:    Posture is normal. normal erect    Strength:    Strength is V/V in the upper and lower limbs.      Sensation: intact to LT     Reflex Exam:  DTR's:    Deep tendon reflexes in the upper and lower extremities are normal bilaterally.   Toes:    The toes are equiv  bilaterally.   Clonus:    Clonus is absent.    Assessment/Plan:  Very nice 50 year old bilateral wrist pain and finger paresthesias and numbness. MRI brain and cervical spine essentiall both normal.  May bey Carpal Tunnel Syndrome. Explained CTS, showed her images online, reviewed MRI c-spine images with her and explained emg/ncs procedure and conservative measures such as splinting. She will make an emg/ncs appt in 6-8 weeks and can cancel if the splinting helps significantly. We discussed CTS release surgery as well. She has been to ent/GI for difficulty swallowing, globus feeling, drainage, may be allergies, discuss flonase/netti pot/decongestant and allergy referral with primary care.    Addendum: EMG/NCS normal, but she has not been splinting, still think her median nerves are being irritated by repetitive movement but there appears to be no damage, recommend splinting both wrists for 4-6 weeks and then maybe steroid injections into the wrists if needed. Splinting alone may resolve symptoms.  Orders Placed This Encounter  Procedures   NCV  with EMG(electromyography)     Cc: , MD,  44, Glynn Octave, MD  Philip Aspen, MD  [facility] [address]  [place], [place] [address]  Phone [phone] Fax [phone]

## 2021-08-01 ENCOUNTER — Encounter (HOSPITAL_BASED_OUTPATIENT_CLINIC_OR_DEPARTMENT_OTHER): Payer: Self-pay

## 2021-08-01 ENCOUNTER — Other Ambulatory Visit: Payer: Self-pay

## 2021-08-01 DIAGNOSIS — E039 Hypothyroidism, unspecified: Secondary | ICD-10-CM | POA: Diagnosis not present

## 2021-08-01 DIAGNOSIS — Z79899 Other long term (current) drug therapy: Secondary | ICD-10-CM | POA: Diagnosis not present

## 2021-08-01 DIAGNOSIS — R072 Precordial pain: Secondary | ICD-10-CM | POA: Diagnosis present

## 2021-08-01 LAB — CBC
HCT: 41.9 % (ref 36.0–46.0)
Hemoglobin: 13.4 g/dL (ref 12.0–15.0)
MCH: 25.8 pg — ABNORMAL LOW (ref 26.0–34.0)
MCHC: 32 g/dL (ref 30.0–36.0)
MCV: 80.7 fL (ref 80.0–100.0)
Platelets: 342 10*3/uL (ref 150–400)
RBC: 5.19 MIL/uL — ABNORMAL HIGH (ref 3.87–5.11)
RDW: 14.2 % (ref 11.5–15.5)
WBC: 7.7 10*3/uL (ref 4.0–10.5)
nRBC: 0 % (ref 0.0–0.2)

## 2021-08-01 LAB — BASIC METABOLIC PANEL
Anion gap: 9 (ref 5–15)
BUN: 15 mg/dL (ref 6–20)
CO2: 26 mmol/L (ref 22–32)
Calcium: 8.8 mg/dL — ABNORMAL LOW (ref 8.9–10.3)
Chloride: 103 mmol/L (ref 98–111)
Creatinine, Ser: 0.73 mg/dL (ref 0.44–1.00)
GFR, Estimated: >60 mL/min (ref >60)
Glucose, Bld: 90 mg/dL (ref 70–99)
Potassium: 3.2 mmol/L — ABNORMAL LOW (ref 3.5–5.1)
Sodium: 138 mmol/L (ref 135–145)

## 2021-08-01 LAB — TROPONIN I (HIGH SENSITIVITY): Troponin I (High Sensitivity): 6 ng/L (ref <18)

## 2021-08-01 NOTE — ED Triage Notes (Signed)
Pt presents with CP x 1 hour that started while she was lying. Pt locates the pain to bilateral clavicles and describes it is "tight." Pt also c/o "tingling" to bilateral wrists. Pt denies ShOB, nausea, or diaphoresis.

## 2021-08-02 ENCOUNTER — Emergency Department (HOSPITAL_BASED_OUTPATIENT_CLINIC_OR_DEPARTMENT_OTHER)
Admission: EM | Admit: 2021-08-02 | Discharge: 2021-08-02 | Disposition: A | Discharge status: Home / Self Care | Payer: BLUE CROSS/BLUE SHIELD | Attending: Emergency Medicine | Admitting: Emergency Medicine

## 2021-08-02 ENCOUNTER — Emergency Department (HOSPITAL_BASED_OUTPATIENT_CLINIC_OR_DEPARTMENT_OTHER): Payer: BLUE CROSS/BLUE SHIELD

## 2021-08-02 DIAGNOSIS — R079 Chest pain, unspecified: Secondary | ICD-10-CM

## 2021-08-02 DIAGNOSIS — R0789 Other chest pain: Secondary | ICD-10-CM

## 2021-08-02 LAB — TROPONIN I (HIGH SENSITIVITY): Troponin I (High Sensitivity): 6 ng/L (ref <18)

## 2021-08-02 NOTE — Discharge Instructions (Signed)
Rest.  Take ibuprofen 600 mg every 6 hours as needed for pain.  Follow-up with your primary doctor if not improving in the next few days, and return to the ER if symptoms significantly worsen or change.

## 2021-08-02 NOTE — ED Provider Notes (Signed)
[facility] Provider Note   CSN: [id number] Arrival date & time: 08/01/21  2218     History  Chief Complaint  Patient presents with   Chest Pain    Nichole Beck is a 51 y.o. female.  Patient is a 51 year old female with past medical history of hypothyroidism.  Patient presenting today with complaints of chest discomfort.  She describes a tightness in her chest that started this afternoon while she was at rest.  She denies any shortness of breath, nausea, or diaphoresis along with the symptoms.  She did report some numbness in her hands at the time.  Patient denies any recent exertional symptoms.  She has no cardiac risk factors and no prior cardiac history.  The history is provided by the patient.  Chest Pain Pain location:  Substernal area Pain quality: tightness   Pain radiates to:  Does not radiate Pain severity:  Moderate Onset quality:  Sudden Progression:  Resolved Chronicity:  New Relieved by:  Nothing Worsened by:  Nothing     Home Medications Prior to Admission medications   Medication Sig Start Date End Date Taking? Authorizing Provider  levothyroxine (SYNTHROID) 100 MCG tablet Take 1 tablet (100 mcg total) by mouth daily. 04/09/21   Isaac Bliss, Rayford Halsted, MD  omeprazole (PRILOSEC) 20 MG capsule Take 1 capsule (20 mg total) by mouth daily. 04/09/21   Daryel November, MD      Allergies    Patient has no known allergies.    Review of Systems   Review of Systems  Cardiovascular:  Positive for chest pain.  All other systems reviewed and are negative.  Physical Exam Updated Vital Signs BP (!) 131/95    Pulse 78    Temp 97.8 F (36.6 C)    Resp (!) 26    Ht 5\' 7"  (1.702 m)    Wt 59 kg    LMP  (LMP Unknown)    SpO2 100%    BMI 20.36 kg/m  Physical Exam Vitals and nursing note reviewed.  Constitutional:      General: She is not in acute distress.    Appearance: She is well-developed. She is not diaphoretic.  HENT:     Head:  Normocephalic and atraumatic.  Cardiovascular:     Rate and Rhythm: Normal rate and regular rhythm.     Heart sounds: No murmur heard.   No friction rub. No gallop.  Pulmonary:     Effort: Pulmonary effort is normal. No respiratory distress.     Breath sounds: Normal breath sounds. No wheezing.  Abdominal:     General: Bowel sounds are normal. There is no distension.     Palpations: Abdomen is soft.     Tenderness: There is no abdominal tenderness.  Musculoskeletal:        General: Normal range of motion.     Cervical back: Normal range of motion and neck supple.     Right lower leg: No tenderness. No edema.     Left lower leg: No tenderness. No edema.  Skin:    General: Skin is warm and dry.  Neurological:     General: No focal deficit present.     Mental Status: She is alert and oriented to person, place, and time.    ED Results / Procedures / Treatments   Labs (all labs ordered are listed, but only abnormal results are displayed) Labs Reviewed  BASIC METABOLIC PANEL - Abnormal; Notable for the following components:  Result Value   Potassium 3.2 (*)    Calcium 8.8 (*)    All other components within normal limits  CBC - Abnormal; Notable for the following components:   RBC 5.19 (*)    MCH 25.8 (*)    All other components within normal limits  TROPONIN I (HIGH SENSITIVITY)  TROPONIN I (HIGH SENSITIVITY)    EKG EKG Interpretation  Date/Time:  Sunday August 01 2021 22:25:25 EST Ventricular Rate:  79 PR Interval:  152 QRS Duration: 72 QT Interval:  380 QTC Calculation: 435 R Axis:   69 Text Interpretation: Normal sinus rhythm Normal ECG When compared with ECG of 27-Feb-2021 08:07, No significant change was found Confirmed by Veryl Speak [id number]) on 08/02/2021 12:48:29 AM  Radiology DG Chest Port 1 View  Result Date: 08/02/2021 CLINICAL DATA:  Chest pain. EXAM: PORTABLE CHEST 1 VIEW COMPARISON:  February 27, 2021 FINDINGS: The heart size and mediastinal contours are  within normal limits. Both lungs are clear. The visualized skeletal structures are unremarkable. IMPRESSION: No active disease. Electronically Signed   By: Virgina Norfolk M.D.   On: 08/02/2021 00:51    Procedures Procedures  Cardiac monitoring  Medications Ordered in ED Medications - No data to display  ED Course/ Medical Decision Making/ A&P  This patient presents to the ED for concern of chest discomfort, this involves an extensive number of treatment options, and is a complaint that carries with it a high risk of complications and morbidity.  The differential diagnosis includes acute coronary syndrome, pulmonary embolism, musculoskeletal pain, esophageal reflux, aortic dissection   Co morbidities that complicate the patient evaluation  None   Additional history obtained:  Additional history obtained from N/A External records from outside source obtained and reviewed including N/A   Lab Tests:  I Ordered, and personally interpreted labs.  The pertinent results include: CBC, basic metabolic panel, and troponin x2.  These are all unremarkable   Imaging Studies ordered:  I ordered imaging studies including chest x-ray I independently visualized and interpreted imaging which showed no acute process I agree with the radiologist interpretation   Cardiac Monitoring:  The patient was maintained on a cardiac monitor.  I personally viewed and interpreted the cardiac monitored which showed an underlying rhythm of: Sinus   Medicines ordered and prescription drug management:  No medications ordered during this visit Reevaluation of the patient after these medicines showed that the patient improved I have reviewed the patients home medicines and have made adjustments as needed   Test Considered:  No other test considered are indicated   Critical Interventions:  None   Consultations Obtained:  No consultations were requested or obtained   Problem List / ED  Course:  Patient presenting here with complaints of chest discomfort which seems atypical for cardiac pain.  Her work-up is unremarkable including EKG and troponin x2.  Remainder of laboratory studies are also unremarkable.  Patient symptoms have resolved and she is now feeling better.  At this point, I feel as though patient can safely be discharged.  I will advise her to follow-up with primary doctor and return if not improving.   Reevaluation:  After the interventions noted above, I reevaluated the patient and found that they have :resolved   Social Determinants of Health:  None   Dispostion:  After consideration of the diagnostic results and the patients response to treatment, I feel that the patent would benefit from outpatient follow-up with primary doctor and return to the ER if symptoms  worsen or change.    Final Clinical Impression(s) / ED Diagnoses Final diagnoses:  Chest pain    Rx / DC Orders ED Discharge Orders     None         Veryl Speak, MD 08/02/21 [phone]

## 2021-08-05 ENCOUNTER — Ambulatory Visit (INDEPENDENT_AMBULATORY_CARE_PROVIDER_SITE_OTHER): Payer: Self-pay | Admitting: Neurology

## 2021-08-05 DIAGNOSIS — M79642 Pain in left hand: Secondary | ICD-10-CM

## 2021-08-05 DIAGNOSIS — M79641 Pain in right hand: Secondary | ICD-10-CM

## 2021-08-05 NOTE — Progress Notes (Addendum)
EMG/NCS normal, she has not been splinting as recommended, still think her median nerves are being irritated by repetitive movement at wrist but there appears to be no apparent nerve damage since NCS normal, recommend splinting both wrists for 4-6 weeks and then maybe steroid injections into the wrists if needed. Splinting alone may resolve symptoms.  Physical Therapy for wrist/hand pain and tingling: Patient with likely carpal tunnel syndrome. EMG/NCS was normal but clinically picture fits, may be so mild is below detection or patient has irritated median nerves and not caused damage yet. Recommend PT for Carpal Tunnel exercises, please fit for braces/splinting, recommend 4-6 weeks of splinting for median nerve irritation across the carpal tunnel. Evaluate and treat. Per christine weaver: To address carpal tunnel symptoms,  this referral need to be OT and the location to be [facility].  They do more splinting at this time.  .    Discussed with patient, encouraged splinting, she agreed to physical therapy.  Orders Placed This Encounter  Procedures   Ambulatory referral to Occupational Therapy    I spent 10 minutes of face-to-face and non-face-to-face time with patient on the  1. Bilateral hand pain    diagnosis.  This included previsit chart review, lab review, study review, order entry, electronic health record documentation, patient education on the different diagnostic and therapeutic options, counseling and coordination of care, risks and benefits of management, compliance, or risk factor reduction.  This does not include time spent on EMG nerve conduction study.

## 2021-08-09 ENCOUNTER — Telehealth: Payer: Self-pay | Admitting: Neurology

## 2021-08-09 NOTE — Telephone Encounter (Signed)
Let patient know for therapy on her wrist I have to send her to neuro PT next door thanks

## 2021-08-09 NOTE — Progress Notes (Signed)
Full Name: Felipe Cabell Gender: Female MRN #: [id number] Date of Birth: 07/10/1971    Visit Date: 08/05/2021 09:30 Age: 51 Years Examining Physician: Naomie Dean, MD  Requesting Provider: Glynn Octave, MD Primary Care Provider:  Philip Aspen, Limmie Patricia, MD Height: 5 feet 7 inch, 130lbs  Patient History:Very nice 51 year old with bilateral wrist pain and finger paresthesias and numbness. MRI brain and cervical spine essentially both normal.  May be Carpal Tunnel Syndrome.  Summary: EMG/NCS performed on the bilateral upper extremities. All nerves and muscles (as indicated in the following tables) were within normal limits.        Conclusion: This is a normal study, no suggestion of mononeuropathy, polyneuropathy or radiculopathy. Clinically it does sound like Carpal Tunnel Syndrome however and still think her median nerves are being irritated by repetitive movement. Recommend splinting both wrists for 4-6 weeks and then primary care may consider steroid injections into the wrists if needed. Splinting alone may resolve symptoms. Will send to physical therapy(ordered). Also return to primary care for further evaluation as clinically warranted.  Naomie Dean, M.D.  [facility] [address], [address] [place], [place] [address] Tel: [phone] Fax: [phone]  Verbal informed consent was obtained from the patient, patient was informed of potential risk of procedure, including bruising, bleeding, hematoma formation, infection, muscle weakness, muscle pain, numbness, among others.        MNC    Nerve / Sites Muscle Latency Ref. Amplitude Ref. Rel Amp Segments Distance Velocity Ref. Area    ms ms mV mV %  cm m/s m/s mVms  L Median - APB     Wrist APB 3.1 ?4.4 12.8 ?4.0 100 Wrist - APB 7   43.2     Upper arm APB 7.2  12.4  96.7 Upper arm - Wrist 22 54 ?49 42.2  R Median - APB     Wrist APB 3.0 ?4.4 8.6 ?4.0 100 Wrist - APB 7   35.2     Upper arm APB 7.3   9.1  106 Upper arm - Wrist 22 51 ?49 35.5  L Ulnar - ADM     Wrist ADM 2.8 ?3.3 14.5 ?6.0 100 Wrist - ADM 7   48.8     B.Elbow ADM 6.2  13.5  92.9 B.Elbow - Wrist 20 58 ?49 49.1     A.Elbow ADM 7.9  13.1  97.5 A.Elbow - B.Elbow 10 58 ?49 49.5  R Ulnar - ADM     Wrist ADM 2.7 ?3.3 13.4 ?6.0 100 Wrist - ADM 7   39.3     B.Elbow ADM 6.0  11.2  83.2 B.Elbow - Wrist 19 58 ?49 36.3     A.Elbow ADM 7.8  11.6  104 A.Elbow - B.Elbow 10 57 ?49 39.5             SNC    Nerve / Sites Rec. Site Peak Lat Ref.  Amp Ref. Segments Distance Peak Diff Ref.    ms ms V V  cm ms ms  L Median, Ulnar - Transcarpal comparison     Median Palm Wrist 2.1 ?2.2 54 ?35 Median Palm - Wrist 8       Ulnar Palm Wrist 2.1 ?2.2 25 ?12 Ulnar Palm - Wrist 8          Median Palm - Ulnar Palm  0.0 ?0.4  R Median, Ulnar - Transcarpal comparison     Median Palm Wrist 1.9 ?  2.2 133 ?35 Median Palm - Wrist 8       Ulnar Palm Wrist 2.1 ?2.2 41 ?12 Ulnar Palm - Wrist 8          Median Palm - Ulnar Palm  -0.1 ?0.4  L Median - Orthodromic (Dig II, Mid palm)     Dig II Wrist 3.1 ?3.4 20 ?10 Dig II - Wrist 13    R Median - Orthodromic (Dig II, Mid palm)     Dig II Wrist 2.9 ?3.4 21 ?10 Dig II - Wrist 13    L Ulnar - Orthodromic, (Dig V, Mid palm)     Dig V Wrist 2.9 ?3.1 12 ?5 Dig V - Wrist 11    R Ulnar - Orthodromic, (Dig V, Mid palm)     Dig V Wrist 2.9 ?3.1 13 ?5 Dig V - Wrist 44                   F  Wave    Nerve F Lat Ref.   ms ms  L Ulnar - ADM 28.1 ?32.0  R Ulnar - ADM 26.9 ?32.0         EMG Summary Table    Spontaneous MUAP Recruitment  Muscle IA Fib PSW Fasc Other Amp Dur. Poly Pattern  R. Deltoid Normal None None None _______ Normal Normal Normal Normal  R. Triceps brachii Normal None None None _______ Normal Normal Normal Normal  R. Pronator teres Normal None None None _______ Normal Normal Normal Normal  R. First dorsal interosseous Normal None None None _______ Normal Normal Normal Normal  R. Opponens  pollicis Normal None None None _______ Normal Normal Normal Normal

## 2021-08-09 NOTE — Procedures (Signed)
Full Name: Nichole Beck Gender: Female MRN #: [id number] Date of Birth: 07/10/1971    Visit Date: 08/05/2021 09:30 Age: 51 Years Examining Physician: Naomie Dean, MD  Requesting Provider: Glynn Octave, MD Primary Care Provider:  Philip Aspen, Limmie Patricia, MD Height: 5 feet 7 inch, 130lbs  Patient History:Very nice 51 year old with bilateral wrist pain and finger paresthesias and numbness. MRI brain and cervical spine essentially both normal.  May be Carpal Tunnel Syndrome.  Summary: EMG/NCS performed on the bilateral upper extremities. All nerves and muscles (as indicated in the following tables) were within normal limits.        Conclusion: This is a normal study, no suggestion of mononeuropathy, polyneuropathy or radiculopathy. Clinically it does sound like Carpal Tunnel Syndrome however and still think her median nerves are being irritated by repetitive movement. Recommend splinting both wrists for 4-6 weeks and then primary care may consider steroid injections into the wrists if needed. Splinting alone may resolve symptoms. Will send to physical therapy(ordered). Also return to primary care for further evaluation as clinically warranted.  Naomie Dean, M.D.  [facility] [address], [address] [place], [place] [address] Tel: [phone] Fax: [phone]  Verbal informed consent was obtained from the patient, patient was informed of potential risk of procedure, including bruising, bleeding, hematoma formation, infection, muscle weakness, muscle pain, numbness, among others.        MNC    Nerve / Sites Muscle Latency Ref. Amplitude Ref. Rel Amp Segments Distance Velocity Ref. Area    ms ms mV mV %  cm m/s m/s mVms  L Median - APB     Wrist APB 3.1 ?4.4 12.8 ?4.0 100 Wrist - APB 7   43.2     Upper arm APB 7.2  12.4  96.7 Upper arm - Wrist 22 54 ?49 42.2  R Median - APB     Wrist APB 3.0 ?4.4 8.6 ?4.0 100 Wrist - APB 7   35.2     Upper arm APB 7.3   9.1  106 Upper arm - Wrist 22 51 ?49 35.5  L Ulnar - ADM     Wrist ADM 2.8 ?3.3 14.5 ?6.0 100 Wrist - ADM 7   48.8     B.Elbow ADM 6.2  13.5  92.9 B.Elbow - Wrist 20 58 ?49 49.1     A.Elbow ADM 7.9  13.1  97.5 A.Elbow - B.Elbow 10 58 ?49 49.5  R Ulnar - ADM     Wrist ADM 2.7 ?3.3 13.4 ?6.0 100 Wrist - ADM 7   39.3     B.Elbow ADM 6.0  11.2  83.2 B.Elbow - Wrist 19 58 ?49 36.3     A.Elbow ADM 7.8  11.6  104 A.Elbow - B.Elbow 10 57 ?49 39.5             SNC    Nerve / Sites Rec. Site Peak Lat Ref.  Amp Ref. Segments Distance Peak Diff Ref.    ms ms V V  cm ms ms  L Median, Ulnar - Transcarpal comparison     Median Palm Wrist 2.1 ?2.2 54 ?35 Median Palm - Wrist 8       Ulnar Palm Wrist 2.1 ?2.2 25 ?12 Ulnar Palm - Wrist 8          Median Palm - Ulnar Palm  0.0 ?0.4  R Median, Ulnar - Transcarpal comparison     Median Palm Wrist 1.9 ?  2.2 133 ?35 Median Palm - Wrist 8    °   Ulnar Palm Wrist 2.1 ?2.2 41 ?12 Ulnar Palm - Wrist 8    °      Median Palm - Ulnar Palm  -0.1 ?0.4  °L Median - Orthodromic (Dig II, Mid palm)  °   Dig II Wrist 3.1 ?3.4 20 ?10 Dig II - Wrist 13    °R Median - Orthodromic (Dig II, Mid palm)  °   Dig II Wrist 2.9 ?3.4 21 ?10 Dig II - Wrist 13    °L Ulnar - Orthodromic, (Dig V, Mid palm)  °   Dig V Wrist 2.9 ?3.1 12 ?5 Dig V - Wrist 11    °R Ulnar - Orthodromic, (Dig V, Mid palm)  °   Dig V Wrist 2.9 ?3.1 13 ?5 Dig V - Wrist 11    °               °F  Wave °   °Nerve F Lat Ref.  ° ms ms  °L Ulnar - ADM 28.1 ?32.0  °R Ulnar - ADM 26.9 ?32.0  °       °EMG Summary Table   ° Spontaneous MUAP Recruitment  °Muscle IA Fib PSW Fasc Other Amp Dur. Poly Pattern  °R. Deltoid Normal None None None _______ Normal Normal Normal Normal  °R. Triceps brachii Normal None None None _______ Normal Normal Normal Normal  °R. Pronator teres Normal None None None _______ Normal Normal Normal Normal  °R. First dorsal interosseous Normal None None None _______ Normal Normal Normal Normal  °R. Opponens  pollicis Normal None None None _______ Normal Normal Normal Normal  ° °  °

## 2021-08-17 ENCOUNTER — Telehealth: Payer: Self-pay

## 2021-08-17 NOTE — Telephone Encounter (Signed)
--  Caller states she needs to schedule an appt with the doctor. She just had ems check her out and she is having chest pains, hard time breathing, and swallowing, and tingling in her fingers. Pt has seen someone for the swallowing issue  08/17/2021 1:26:37 PM Call EMS 911 Now Iona Coach, RN, Christina  08/17/20 1524: Pt states she wanted to make appt to f/u with PCP. Per her statement, she was seen for same pain on 08/05/21 in ED & recommendation was to f/u with PCP. Patient presenting here with complaints of chest discomfort which seems atypical for cardiac pain.  Her work-up is unremarkable including EKG and troponin x2.  Remainder of laboratory studies are also unremarkable.  Patient symptoms have resolved and she is now feeling better.  At this point, I feel as though patient can safely be discharged.  I will advise her to follow-up with primary doctor and return if not improving.  Pt reporting same pain as this visit. OV scheduled for 1/19. Pt advised to return to ED if pain changed in any way or she developed more symptoms. Pt verb understanding.

## 2021-08-19 ENCOUNTER — Ambulatory Visit (INDEPENDENT_AMBULATORY_CARE_PROVIDER_SITE_OTHER): Payer: Commercial Managed Care - PPO | Admitting: Internal Medicine

## 2021-08-19 ENCOUNTER — Telehealth: Payer: Self-pay | Admitting: Internal Medicine

## 2021-08-19 VITALS — BP 120/72 | HR 84 | Temp 98.1°F | Wt 148.5 lb

## 2021-08-19 DIAGNOSIS — E042 Nontoxic multinodular goiter: Secondary | ICD-10-CM | POA: Diagnosis not present

## 2021-08-19 DIAGNOSIS — L608 Other nail disorders: Secondary | ICD-10-CM

## 2021-08-19 DIAGNOSIS — R1310 Dysphagia, unspecified: Secondary | ICD-10-CM

## 2021-08-19 DIAGNOSIS — E039 Hypothyroidism, unspecified: Secondary | ICD-10-CM

## 2021-08-19 LAB — TSH: TSH: 1.48 u[IU]/mL (ref 0.35–5.50)

## 2021-08-19 NOTE — Progress Notes (Signed)
Established Patient Office Visit     This visit occurred during the SARS-CoV-2 public health emergency.  Safety protocols were in place, including screening questions prior to the visit, additional usage of staff PPE, and extensive cleaning of exam room while observing appropriate contact time as indicated for disinfecting solutions.    CC/Reason for Visit: Continued concerns about dysphagia  HPI: Nichole Beck is a 51 y.o. female who is coming in today for the above mentioned reasons. Past Medical History is significant for: Hypothyroidism, thyroid nodules.  Since the summer of last year she has had an extensive work-up for what she describes as dysphagia and a globus sensation.  I will try and summarize her work-up below:  1.She has had 2 CTs of soft tissue neck, first 1 showed sinusitis that was treated with amoxicillin, second 1 was normal. 2.  She had a barium swallow in August that was normal. 3.  She was seen by ENT in August and had an unremarkable nasopharyngoscopy. 4.  She was seen by GI and had upper and lower endoscopies in October with only signs of minor duodenitis, no esophagitis including eosinophilic esophagitis. 5.  She had a thyroid ultrasound in October that showed multiple thyroid nodules with recommended 1 year follow-up. 6.  At 1 point she was seen in the emergency department for chest pain and fingertip tingling had MRIs of brain and cervical spine that only showed a small central disc protrusion at C4-C5 without spinal canal or neuroforaminal stenosis.  Cardiac work-up was unremarkable.  She continues to feel very frustrated due to lack of diagnosis.  She is requesting referral today to an allergist.  It is of note that she has declined COVID and flu vaccinations.  She is due to have TSH checked today.  She continues to follow with neurology for her bilateral hand tingling.  It is thought that she might have carpal tunnel and has been advised to do wrist splints and  PT but has not done either yet.  She has a left second toenail deformity and is requesting podiatry referral today.   Past Medical/Surgical History: Past Medical History:  Diagnosis Date   Abnormal Pap smear of cervix    many yrs ago   Anemia    Hypothyroidism    Nicotine dependence    STD (sexually transmitted disease)    gonorrhea treated    Past Surgical History:  Procedure Laterality Date   BREAST BIOPSY     COLPOSCOPY     TUBAL LIGATION      Social History:  reports that she has been smoking cigarettes. She has been smoking an average of .5 packs per day. She has never used smokeless tobacco. She reports current alcohol use. She reports that she does not use drugs.  Allergies: No Known Allergies  Family History:  Family History  Problem Relation Age of Onset   Cancer Maternal Grandmother    Diabetes Maternal Grandmother    Cancer Paternal Grandmother    Diabetes Paternal Grandmother    Breast cancer Neg Hx    Colon polyps Neg Hx    Colon cancer Neg Hx    Esophageal cancer Neg Hx    Rectal cancer Neg Hx    Stomach cancer Neg Hx      Current Outpatient Medications:    levothyroxine (SYNTHROID) 100 MCG tablet, Take 1 tablet (100 mcg total) by mouth daily., Disp: 90 tablet, Rfl: 3   omeprazole (PRILOSEC) 20 MG capsule, Take 1 capsule (  20 mg total) by mouth daily., Disp: 30 capsule, Rfl: 1  Current Facility-Administered Medications:    0.9 %  sodium chloride infusion, 500 mL, Intravenous, Once, Daryel November, MD  Review of Systems:  Constitutional: Denies fever, chills, diaphoresis, appetite change and fatigue.  HEENT: Denies photophobia, eye pain, redness, hearing loss, e mouth sores, trouble swallowing, neck pain, neck stiffness and tinnitus.   Respiratory: Denies SOB, DOE, cough, chest tightness,  and wheezing.   Cardiovascular: Denies chest pain, palpitations and leg swelling.  Gastrointestinal: Denies nausea, vomiting, abdominal pain, diarrhea,  constipation, blood in stool and abdominal distention.  Genitourinary: Denies dysuria, urgency, frequency, hematuria, flank pain and difficulty urinating.  Endocrine: Denies: hot or cold intolerance, sweats, changes in hair or nails, polyuria, polydipsia. Musculoskeletal: Denies myalgias, back pain, joint swelling, arthralgias and gait problem.  Skin: Denies pallor, rash and wound.  Neurological: Denies dizziness, seizures, syncope, weakness, light-headedness, numbness and headaches.  Hematological: Denies adenopathy. Easy bruising, personal or family bleeding history  Psychiatric/Behavioral: Denies suicidal ideation, mood changes, confusion, nervousness, sleep disturbance and agitation    Physical Exam: Vitals:   08/19/21 1058  BP: 120/72  Pulse: 84  Temp: 98.1 F (36.7 C)  TempSrc: Oral  SpO2: 94%  Weight: 148 lb 8 oz (67.4 kg)    Body mass index is 23.26 kg/m.   Constitutional: NAD, calm, comfortable Eyes: PERRL, lids and conjunctivae normal ENMT: Mucous membranes are moist. Posterior pharynx clear of any exudate or lesions. Normal dentition. Tympanic membrane is pearly white, no erythema or bulging. Neck: normal, supple, no masses, no thyromegaly Respiratory: clear to auscultation bilaterally, no wheezing, no crackles. Normal respiratory effort. No accessory muscle use.  Cardiovascular: Regular rate and rhythm, no murmurs / rubs / gallops. No extremity edema.  Neurologic: Grossly intact and nonfocal Psychiatric: Normal judgment and insight. Alert and oriented x 3. Normal mood.    Impression and Plan:  Toenail deformity  - Plan: Ambulatory referral to Podiatry  Dysphagia, unspecified type  - Plan: Ambulatory referral to Allergy per request, I believe work-up for this issue is complete at this time.  Hypothyroidism, unspecified type  - Plan: TSH  Multiple thyroid nodules -Repeat thyroid ultrasound in October/2023  Time spent: 34 minutes doing extensive chart  review, interviewing and examining patient and formulating plan of care.    Lelon Frohlich, MD [facility]

## 2021-08-19 NOTE — Telephone Encounter (Signed)
Patient came in today on 01/19 to see and speak with Dr.Hernandez.  Patient provided me with a date and provider so that Dr.Hernandez could find the notes regarding what they discussed today during her visit.   The note could be found under 11/11/2020 with Dr. Ripley Fraise.  Patient is requesting to be contacted at [phone].  Please advise.

## 2021-08-19 NOTE — Telephone Encounter (Signed)
Called pt to get clarity on her needs. States she will call back (her phone was breaking up.)

## 2021-08-23 NOTE — Telephone Encounter (Signed)
Pt states she noted Small left maxillary sinus retention cyst on CT from 11/11/20 & wanted to know if this might be contributing to her issues noted in OV on 08/19/21 with PCP.

## 2021-08-24 ENCOUNTER — Other Ambulatory Visit: Payer: Self-pay

## 2021-08-24 ENCOUNTER — Ambulatory Visit (INDEPENDENT_AMBULATORY_CARE_PROVIDER_SITE_OTHER): Payer: Commercial Managed Care - PPO | Admitting: Podiatry

## 2021-08-24 DIAGNOSIS — L603 Nail dystrophy: Secondary | ICD-10-CM | POA: Diagnosis not present

## 2021-08-24 NOTE — Progress Notes (Signed)
°  Subjective:  Patient ID: Nichole Beck, female    DOB: 18-Sep-1970,  MRN: [id number] HPI Chief Complaint  Patient presents with   Nail Problem     Lt 2nd toenail pinched nail /disformity x 1 yr - no trauma/injury - no pain/thick/discolored Tx: none    51 y.o. female presents with the above complaint.   ROS: Denies fever chills nausea run muscle aches pains calf pain back pain chest pain shortness of breath.  Past Medical History:  Diagnosis Date   Abnormal Pap smear of cervix    many yrs ago   Anemia    Hypothyroidism    Nicotine dependence    STD (sexually transmitted disease)    gonorrhea treated   Past Surgical History:  Procedure Laterality Date   BREAST BIOPSY     COLPOSCOPY     TUBAL LIGATION      Current Outpatient Medications:    levothyroxine (SYNTHROID) 100 MCG tablet, Take 1 tablet (100 mcg total) by mouth daily., Disp: 90 tablet, Rfl: 3  Current Facility-Administered Medications:    0.9 %  sodium chloride infusion, 500 mL, Intravenous, Once, Tomasa Rand, Dub Amis, MD  No Known Allergies Review of Systems Objective:  There were no vitals filed for this visit.  General: Well developed, nourished, in no acute distress, alert and oriented x3   Dermatological: Skin is warm, dry and supple bilateral. Nails x 10 are well maintained; remaining integument appears unremarkable at this time. There are no open sores, no preulcerative lesions, no rash or signs of infection present.  Toenail to the second digit of the left foot is slightly hypertrophic it is thickened and incurvated along the tibial border.  Does not appear to be mycotic.  Vascular: Dorsalis Pedis artery and Posterior Tibial artery pedal pulses are 2/4 bilateral with immedate capillary fill time. Pedal hair growth present. No varicosities and no lower extremity edema present bilateral.   Neruologic: Grossly intact via light touch bilateral. Vibratory intact via tuning fork bilateral. Protective threshold  with Semmes Wienstein monofilament intact to all pedal sites bilateral. Patellar and Achilles deep tendon reflexes 2+ bilateral. No Babinski or clonus noted bilateral.   Musculoskeletal: No gross boney pedal deformities bilateral. No pain, crepitus, or limitation noted with foot and ankle range of motion bilateral. Muscular strength 5/5 in all groups tested bilateral.  Mild hammertoe deformity mallet toe deformity distal aspect second digit left foot.  Gait: Unassisted, Nonantalgic.    Radiographs:  None taken  Assessment & Plan:   Assessment: Nail dystrophy second digit left  Plan: Discussed debriding of the second digital nail plate to keep the cut short and filed thin.  Follow-up with [place] as needed     Nichole Beck, [place]

## 2021-08-25 NOTE — Telephone Encounter (Signed)
Pt notified of PCP response & verb understanding. Denies ques/concerns at this time.  °

## 2021-09-09 ENCOUNTER — Other Ambulatory Visit: Payer: Self-pay

## 2021-09-09 ENCOUNTER — Encounter: Payer: Self-pay | Admitting: Allergy & Immunology

## 2021-09-09 ENCOUNTER — Ambulatory Visit (INDEPENDENT_AMBULATORY_CARE_PROVIDER_SITE_OTHER): Payer: Commercial Managed Care - PPO | Admitting: Allergy & Immunology

## 2021-09-09 VITALS — BP 118/84 | HR 88 | Temp 97.7°F | Resp 16 | Ht 67.0 in | Wt 147.2 lb

## 2021-09-09 DIAGNOSIS — J3089 Other allergic rhinitis: Secondary | ICD-10-CM | POA: Diagnosis not present

## 2021-09-09 DIAGNOSIS — R1319 Other dysphagia: Secondary | ICD-10-CM

## 2021-09-09 DIAGNOSIS — J302 Other seasonal allergic rhinitis: Secondary | ICD-10-CM

## 2021-09-09 MED ORDER — TRIAMCINOLONE ACETONIDE 55 MCG/ACT NA AERO: 2.0000 | INHALATION_SPRAY | Freq: Every day | NASAL | 5 refills | Status: DC

## 2021-09-09 MED ORDER — FEXOFENADINE HCL 180 MG PO TABS: 180.0000 mg | ORAL_TABLET | Freq: Every day | ORAL | 5 refills | Status: DC

## 2021-09-09 MED ORDER — AZELASTINE HCL 0.1 % NA SOLN: 2.0000 | Freq: Two times a day (BID) | NASAL | 5 refills | Status: DC | PRN

## 2021-09-09 MED ORDER — IPRATROPIUM BROMIDE 0.03 % NA SOLN: 2.0000 | Freq: Three times a day (TID) | NASAL | 6 refills | Status: DC

## 2021-09-09 NOTE — Progress Notes (Signed)
NEW PATIENT  Date of Service/Encounter:  09/09/21  Consult requested by: Nichole Beck, Nichole PatriciaEstela Y, MD   Assessment:   Seasonal and perennial allergic rhinitis (grasses, ragweed, weeds, trees, dust mites, cat, dog, cockroach, and tobacco) - uncontrolled  Dysphagia - with normal GI and ENT workup  Plan/Recommendations:   1. Seasonal and perennial allergic rhinitis - Testing today showed: grasses, ragweed, weeds, trees, dust mites, cat, dog, cockroach, and tobacco . - Copy of test results provided.  - Avoidance measures provided. - Your smoking is likely contributing to this (especially with tobacco as an allergen), but let's work on getting these symptoms under better control before we work on smoking cessation.  - Stop taking: all of your current medications - Start taking: Allegra (fexofenadine) 180mg  tablet once daily, Nasacort (triamcinolone) two sprays per nostril daily (AIM FOR EAR ON EACH SIDE), Atrovent (ipratropium) 0.03% one spray per nostril 2-3 times daily as needed (CAN BE OVER DRYING), and Astelin (azelastine) 2 sprays per nostril 1-2 times daily as needed - You can use an extra dose of the antihistamine, if needed, for breakthrough symptoms.  - Consider nasal saline rinses 1-2 times daily to remove allergens from the nasal cavities as well as help with mucous clearance (this is especially helpful to do before the nasal sprays are given) - Strongly consider allergy shots as a means of long-term control. - Allergy shots "re-train" and "reset" the immune system to ignore environmental allergens and decrease the resulting immune response to those allergens (sneezing, itchy watery eyes, runny nose, nasal congestion, etc).    - Allergy shots improve symptoms in 75-85% of patients.  - We can discuss more at the next appointment if the medications are not working for you.  2. Dysphagia (difficulty swallowing) - I think this is all due to the postnasal drip that is not under  good control. - It seems that you have seen a lot of other specialists as well. - We will hold off on any further referrals at this point in time.  3. Return in about 6 weeks (around 10/21/2021).    This note in its entirety was forwarded to the Provider who requested this consultation.  Subjective:   Nichole Beck is a 51 Beck.o. female presenting today for evaluation of  Chief Complaint  Patient presents with   Allergy Testing   Other    Constant colds (fighting it for a year now), feels like something is stuck in her throat all the time, some times is afraid to swallow. Had multiple test run and they say there isn't anything there so they referred her here.    Nichole Beck has a history of the following: Patient Active Problem List   Diagnosis Date Noted   Bilateral hand pain 05/31/2021   Multiple thyroid nodules 05/25/2021   Dysphagia 03/25/2021   Hypothyroidism     History obtained from: chart review and patient.  Nichole Beck was referred by Nichole Beck, Nichole PatriciaEstela Y, MD.     [name] is a 51 Beck.o. female presenting for an evaluation of dysphagia and mucous production .  She has been having an ongoing situation for over one year. She reports that there eis something that seems to be stuck in her throat. She has a lot of mucous in her head. This al lstarted March of2022. She has been seeing a lot of specialists and she has not been albe to see hwat is going on. It is affecting her work.   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  They are allowed to bring dogs on the bus.  She never had allergies or asthma as a child. She has had a lot of sneezing and blowing of her nose. She is unsure if there is something that is "built up in her sinuses". She reports a lot of sinus pain and jaw pain.  She has only tried Flonase for her symptoms. It did not help. She has not tried saline rinses. She has tried Mucinex without much relief. She has tried  Prilosec and this did not help at all. She has otherwise not tried any kind of  She has had quite the work-up.  He has had 2 CTs of the soft tissue of the neck.  1 showed a mild paranasal sinus disease and another showed a small sinus retention cyst.  She had an MRI in August 2022 that was normal from a sinus perspective.  She was seen by ENT in August 2022 and had an unremarkable nasopharyngoscopy.  She had a barium swallow in August 2022 that was normal.  She saw gastroenterology in October 2022 and had an upper and lower endoscopy with signs of mild to would duodenitis and no esophagitis or EOE.  She had a thyroid ultrasound in October that showed multiple nodules with a 1 year follow-up recommended.  Cardiac work-up has been normal.  She does have some hand pain that is thought to be carpal tunnel. She was evaluated by Dr. Lucia Gaskins with Neurology. There is discussion of doing a carpal tunnel release, but they are holding for now.   Otherwise, there is no history of other atopic diseases, including drug allergies, stinging insect allergies, eczema, urticaria, or contact dermatitis. There is no significant infectious history. Vaccinations are up to date.    Past Medical History: Patient Active Problem List   Diagnosis Date Noted   Bilateral hand pain 05/31/2021   Multiple thyroid nodules 05/25/2021   Dysphagia 03/25/2021   Hypothyroidism     Medication List:  Allergies as of 09/09/2021   No Known Allergies      Medication List        Accurate as of September 09, 2021  6:43 PM. If you have any questions, ask your nurse or doctor.          azelastine 0.1 % nasal spray Commonly known as: ASTELIN Place 2 sprays into both nostrils 2 (two) times daily as needed for rhinitis. Use in each nostril as directed Started by: Alfonse Spruce, MD   fexofenadine 180 MG tablet Commonly known as: Allegra Allergy Take 1 tablet (180 mg total) by mouth daily. Started by: Alfonse Spruce,  MD   ipratropium 0.03 % nasal spray Commonly known as: ATROVENT Place 2 sprays into both nostrils 3 (three) times daily. Started by: Alfonse Spruce, MD   levothyroxine 100 MCG tablet Commonly known as: SYNTHROID Take 1 tablet (100 mcg total) by mouth daily.   triamcinolone 55 MCG/ACT Aero nasal inhaler Commonly known as: NASACORT Place 2 sprays into the nose daily. Started by: Alfonse Spruce, MD        Birth History: non-contributory  Developmental History: non-contributory  Past Surgical History: Past Surgical History:  Procedure Laterality Date   BREAST BIOPSY     COLPOSCOPY     TUBAL LIGATION       Family History: Family History  Problem Relation Age of Onset   Cancer Maternal Grandmother  Diabetes Maternal Grandmother    Cancer Paternal Grandmother    Diabetes Paternal Grandmother    Asthma Daughter    Breast cancer Neg Hx    Colon polyps Neg Hx    Colon cancer Neg Hx    Esophageal cancer Neg Hx    Rectal cancer Neg Hx    Stomach cancer Neg Hx      Social History: Leonora lives at home with her family.  She lives in a townhome. There is carpeting throughout the home. They have electric heating and central cooling. There are no animals inside or outside of the home. There are no dust mite coverings on the bedding. There is tobacco exposure in the home and the car. She currently works as a Midwife. She is exposed to fumes and chemicals and dust there. She does keep the bus windows open.    Review of Systems  Constitutional: Negative.  Negative for fever, malaise/fatigue and weight loss.  HENT:  Positive for congestion. Negative for ear discharge and ear pain.   Eyes:  Negative for pain, discharge and redness.  Respiratory:  Negative for cough, sputum production, shortness of breath and wheezing.   Cardiovascular: Negative.  Negative for chest pain and palpitations.  Gastrointestinal:  Negative for abdominal pain, constipation, diarrhea,  heartburn, nausea and vomiting.       Positive for dysphagia.  Skin: Negative.  Negative for itching and rash.  Neurological:  Negative for dizziness and headaches.  Endo/Heme/Allergies:  Negative for environmental allergies. Does not bruise/bleed easily.      Objective:   Blood pressure 118/84, pulse 88, temperature 97.7 F (36.5 C), temperature source Temporal, resp. rate 16, height 5\' 7"  (1.702 m), weight 147 lb 3.2 oz (66.8 kg). Body mass index is 23.05 kg/m.   Physical Exam:   Physical Exam Vitals reviewed.  Constitutional:      Appearance: She is well-developed.  HENT:     Head: Normocephalic and atraumatic.     Right Ear: Tympanic membrane, ear canal and external ear normal. No drainage, swelling or tenderness. Tympanic membrane is not injected, scarred, erythematous, retracted or bulging.     Left Ear: Tympanic membrane, ear canal and external ear normal. No drainage, swelling or tenderness. Tympanic membrane is not injected, scarred, erythematous, retracted or bulging.     Nose: Mucosal edema and rhinorrhea present. No nasal deformity or septal deviation.     Right Turbinates: Enlarged, swollen and pale.     Left Turbinates: Enlarged, swollen and pale.     Right Sinus: No maxillary sinus tenderness or frontal sinus tenderness.     Left Sinus: No maxillary sinus tenderness or frontal sinus tenderness.     Mouth/Throat:     Mouth: Mucous membranes are not pale and not dry.     Pharynx: Uvula midline.     Comments: Cobblestoning present in the posterior oropharynx. Tonsils normally sized bilaterally. Eyes:     General: Lids are normal. Allergic shiner present.        Right eye: No discharge.        Left eye: No discharge.     Conjunctiva/sclera: Conjunctivae normal.     Right eye: Right conjunctiva is not injected. No chemosis.    Left eye: Left conjunctiva is not injected. No chemosis.    Pupils: Pupils are equal, round, and reactive to light.  Cardiovascular:      Rate and Rhythm: Normal rate and regular rhythm.     Heart sounds: Normal heart sounds.  Pulmonary:     Effort: Pulmonary effort is normal. No tachypnea, accessory muscle usage or respiratory distress.     Breath sounds: Normal breath sounds. No wheezing, rhonchi or rales.     Comments: Moving air well in all lung fields. No increased work of breathing noted.  Chest:     Chest wall: No tenderness.  Abdominal:     Tenderness: There is no abdominal tenderness. There is no guarding or rebound.  Lymphadenopathy:     Head:     Right side of head: No submandibular, tonsillar or occipital adenopathy.     Left side of head: No submandibular, tonsillar or occipital adenopathy.     Cervical: No cervical adenopathy.  Skin:    General: Skin is warm.     Capillary Refill: Capillary refill takes less than 2 seconds.     Coloration: Skin is not pale.     Findings: No abrasion, erythema, petechiae or rash. Rash is not papular, urticarial or vesicular.     Comments: No eczematous or urticarial lesions noted.  Neurological:     Mental Status: She is alert.  Psychiatric:        Behavior: Behavior is cooperative.     Diagnostic studies:   Allergy Studies:     Airborne Adult Perc - 09/09/21 1005     Time Antigen Placed 0945    Allergen Manufacturer Waynette Buttery    Location Back    Number of Test 59    1. Control-Buffer 50% Glycerol Negative    2. Control-Histamine 1 mg/ml 2+    3. Albumin saline Negative    4. Bahia Negative    5. [place] 2+    6. Johnson 2+    7. Kentucky Blue 2+    8. Meadow Fescue 2+    9. Perennial Rye 2+    10. Sweet Vernal 2+    11. Timothy 3+    12. Cocklebur Negative    13. Burweed Marshelder Negative    14. Ragweed, short 2+    15. Ragweed, Giant Negative    16. Plantain,  English Negative    17. Lamb's Quarters 2+    18. Sheep Sorrell Negative    19. Rough Pigweed 2+    20. Marsh Elder, Rough 2+    21. Mugwort, Common Negative    22. Ash mix Negative    23.  Birch mix 2+    24. Beech American Negative    25. Box, Elder Negative    26. Cedar, red 2+    27. Cottonwood, Guinea-Bissau Negative    28. Elm mix 2+    29. Hickory 2+    30. Maple mix Negative    31. Oak, Guinea-[place] mix Negative    32. Pecan Pollen Negative    33. Pine mix Negative    34. Sycamore Eastern Negative    35. Walnut, Black Pollen Negative    36. Alternaria alternata Negative    37. Cladosporium Herbarum Negative    38. Aspergillus mix Negative    39. Penicillium mix Negative    40. Bipolaris sorokiniana (Helminthosporium) Negative    41. Drechslera spicifera (Curvularia) Negative    42. Mucor plumbeus Negative    43. Fusarium moniliforme Negative    44. Aureobasidium pullulans (pullulara) Negative    45. Rhizopus oryzae Negative    46. Botrytis cinera Negative    47. Epicoccum nigrum Negative    48. Phoma betae Negative    49. Candida Albicans Negative  50. Trichophyton mentagrophytes Negative    51. Mite, D Farinae  5,000 AU/ml 3+    52. Mite, D Pteronyssinus  5,000 AU/ml 3+    53. Cat Hair 10,000 BAU/ml Negative    54.  Dog Epithelia 3+    55. Mixed Feathers 2+    56. Horse Epithelia Negative    57. Cockroach, German 3+    58. Mouse Negative    59. Tobacco Leaf 2+             Intradermal - 09/09/21 1005     Time Antigen Placed 1015    Allergen Manufacturer Waynette ButteryGreer    Location Arm    Number of Test 6    Control Negative    Mold 1 Negative    Mold 2 Negative    Mold 3 Negative    Mold 4 Negative    Cat 3+             Allergy testing results were read and interpreted by myself, documented by clinical staff.         Malachi BondsJoel Denver Bentson, MD [facility]

## 2021-09-09 NOTE — Patient Instructions (Addendum)
1. Seasonal and perennial allergic rhinitis - Testing today showed: grasses, ragweed, weeds, trees, dust mites, cat, dog, cockroach, and tobacco . - Copy of test results provided.  - Avoidance measures provided. - Your smoking is likely contributing to this (especially with tobacco as an allergen), but let's work on getting these symptoms under better control before we work on smoking cessation.  - Stop taking: all of your current medications - Start taking: Allegra (fexofenadine) 180mg  tablet once daily, Nasacort (triamcinolone) two sprays per nostril daily (AIM FOR EAR ON EACH SIDE), Atrovent (ipratropium) 0.03% one spray per nostril 2-3 times daily as needed (CAN BE OVER DRYING), and Astelin (azelastine) 2 sprays per nostril 1-2 times daily as needed - You can use an extra dose of the antihistamine, if needed, for breakthrough symptoms.  - Consider nasal saline rinses 1-2 times daily to remove allergens from the nasal cavities as well as help with mucous clearance (this is especially helpful to do before the nasal sprays are given) - Strongly consider allergy shots as a means of long-term control. - Allergy shots "re-train" and "reset" the immune system to ignore environmental allergens and decrease the resulting immune response to those allergens (sneezing, itchy watery eyes, runny nose, nasal congestion, etc).    - Allergy shots improve symptoms in 75-85% of patients.  - We can discuss more at the next appointment if the medications are not working for you.  2. Dysphagia (difficulty swallowing) - I think this is all due to the postnasal drip that is not under good control. - It seems that you have seen a lot of other specialists as well. - We will hold off on any further referrals at this point in time.  3. Return in about 6 weeks (around 10/21/2021).    Please inform us of any Emergency Department visits, hospitalizations, or changes in symptoms. Call us before going to the ED for  breathing or allergy symptoms since we might be able to fit you in for a sick visit. Feel free to contact us anytime with any questions, problems, or concerns.  It was a pleasure to meet you today!  Websites that have reliable patient information: 1. American Academy of Asthma, Allergy, and Immunology: www.aaaai.org 2. Food Allergy Research and Education (FARE): foodallergy.org 3. Mothers of Asthmatics: http://www.asthmacommunitynetwork.org 4. American College of Allergy, Asthma, and Immunology: www.acaai.org   COVID-19 Vaccine Information can be found at: [email or web address].uk For questions related to vaccine distribution or appointments, please email [email or web address] or call [phone].   We realize that you might be concerned about having an allergic reaction to the COVID19 vaccines. To help with that concern, WE ARE OFFERING THE COVID19 VACCINES IN OUR OFFICE! Ask the front desk for dates!     Like [place] on [employer] and Instagram for our latest updates!      A healthy democracy works best when [employer] participate! Make sure you are registered to vote! If you have moved or changed any of your contact information, you will need to get this updated before voting!  In some cases, you MAY be able to register to vote online: [email or web address]      Airborne Adult Perc - 09/09/21 1005     Time Antigen Placed 0945    Allergen Manufacturer Lavella Hammock    Location Back    Number of Test 59    1. Control-Buffer 50% Glycerol Negative    2. Control-Histamine 1 mg/ml 2+    3. Albumin saline Negative    4. [place]  Negative    5. [place] 2+    6. [place] 2+    7. [place] Blue 2+    8. Meadow Fescue 2+    9. Perennial Rye 2+    10. Sweet Vernal 2+    11. Timothy 3+    12. Cocklebur Negative    13. Burweed Marshelder Negative    14. Ragweed, short 2+    15. Ragweed, Giant Negative    16.  Plantain,  English Negative    17. Lamb's Quarters 2+    18. Sheep Sorrell Negative    19. Rough Pigweed 2+    20. Marsh Elder, Rough 2+    21. Mugwort, Common Negative    22. Ash mix Negative    23. Birch mix 2+    24. Beech American Negative    25. Box, Elder Negative    26. Cedar, red 2+    27. Cottonwood, [place] Negative    28. Elm mix 2+    29. Hickory 2+    30. Maple mix Negative    31. Oak, [place] mix Negative    32. Pecan Pollen Negative    33. Pine mix Negative    34. Sycamore Eastern Negative    35. Ravanna, Black Pollen Negative    36. Alternaria alternata Negative    37. Cladosporium Herbarum Negative    38. Aspergillus mix Negative    39. Penicillium mix Negative    40. Bipolaris sorokiniana (Helminthosporium) Negative    41. Drechslera spicifera (Curvularia) Negative    42. Mucor plumbeus Negative    43. Fusarium moniliforme Negative    44. Aureobasidium pullulans (pullulara) Negative    45. Rhizopus oryzae Negative    46. Botrytis cinera Negative    47. Epicoccum nigrum Negative    48. Phoma betae Negative    49. Candida Albicans Negative    50. Trichophyton mentagrophytes Negative    51. Mite, D Farinae  5,000 AU/ml 3+    52. Mite, D Pteronyssinus  5,000 AU/ml 3+    53. Cat Hair 10,000 BAU/ml Negative    54.  Dog Epithelia 3+    55. Mixed Feathers 2+    56. Horse Epithelia Negative    57. Cockroach, German 3+    58. Mouse Negative    59. Tobacco Leaf 2+             Intradermal - 09/09/21 1005     Time Antigen Placed 1015    Allergen Manufacturer Lavella Hammock    Location Arm    Number of Test 6    Control Negative    Mold 1 Negative    Mold 2 Negative    Mold 3 Negative    Mold 4 Negative    Cat 3+             Reducing Pollen Exposure  The American Academy of Allergy, Asthma and Immunology suggests the following steps to reduce your exposure to pollen during allergy seasons.    Do not hang sheets or clothing out to dry; pollen may  collect on these items. Do not mow lawns or spend time around freshly cut grass; mowing stirs up pollen. Keep windows closed at night.  Keep car windows closed while driving. Minimize morning activities outdoors, a time when pollen counts are usually at their highest. Stay indoors as much as possible when pollen counts or humidity is high and on windy days when pollen tends to remain in the air longer. Use air  conditioning when possible.  Many air conditioners have filters that trap the pollen spores. Use a HEPA room air filter to remove pollen form the indoor air you breathe.  Control of Dog or Cat Allergen  Avoidance is the best way to manage a dog or cat allergy. If you have a dog or cat and are allergic to dog or cats, consider removing the dog or cat from the home. If you have a dog or cat but dont want to find it a new home, or if your family wants a pet even though someone in the household is allergic, here are some strategies that may help keep symptoms at bay:  Keep the pet out of your bedroom and restrict it to only a few rooms. Be advised that keeping the dog or cat in only one room will not limit the allergens to that room. Dont pet, hug or kiss the dog or cat; if you do, wash your hands with soap and water. High-efficiency particulate air (HEPA) cleaners run continuously in a bedroom or living room can reduce allergen levels over time. Regular use of a high-efficiency vacuum cleaner or a central vacuum can reduce allergen levels. Giving your dog or cat a bath at least once a week can reduce airborne allergen.  Control of Cockroach Allergen  Cockroach allergen has been identified as an important cause of acute attacks of asthma, especially in urban settings.  There are fifty-five species of cockroach that exist in the [place], however only three, the [place], [place] species produce allergen that can affect patients with Asthma.  Allergens can be obtained from  fecal particles, egg casings and secretions from cockroaches.    Remove food sources. Reduce access to water. Seal access and entry points. Spray runways with 0.5-1% Diazinon or Chlorpyrifos Blow boric acid power under stoves and refrigerator. Place bait stations (hydramethylnon) at feeding sites.  Control of Dust Mite Allergen    Dust mites play a major role in allergic asthma and rhinitis.  They occur in environments with high humidity wherever human skin is found.  Dust mites absorb humidity from the atmosphere (ie, they do not drink) and feed on organic matter (including shed human and animal skin).  Dust mites are a microscopic type of insect that you cannot see with the naked eye.  High levels of dust mites have been detected from mattresses, pillows, carpets, upholstered furniture, bed covers, clothes, soft toys and any woven material.  The principal allergen of the dust mite is found in its feces.  A gram of dust may contain 1,000 mites and 250,000 fecal particles.  Mite antigen is easily measured in the air during house cleaning activities.  Dust mites do not bite and do not cause harm to humans, other than by triggering allergies/asthma.    Ways to decrease your exposure to dust mites in your home:  Encase mattresses, box springs and pillows with a mite-impermeable barrier or cover   Wash sheets, blankets and drapes weekly in hot water (130 F) with detergent and dry them in a dryer on the hot setting.  Have the room cleaned frequently with a vacuum cleaner and a damp dust-mop.  For carpeting or rugs, vacuuming with a vacuum cleaner equipped with a high-efficiency particulate air (HEPA) filter.  The dust mite allergic individual should not be in a room which is being cleaned and should wait 1 hour after cleaning before going into the room. Do not sleep on upholstered furniture (eg, couches).  If possible removing carpeting, upholstered furniture and drapery from the home is ideal.   Horizontal blinds should be eliminated in the rooms where the person spends the most time (bedroom, study, television room).  Washable vinyl, roller-type shades are optimal. Remove all non-washable stuffed toys from the bedroom.  Wash stuffed toys weekly like sheets and blankets above.   Reduce indoor humidity to less than 50%.  Inexpensive humidity monitors can be purchased at most hardware stores.  Do not use a humidifier as can make the problem worse and are not recommended.  Allergy Shots   Allergies are the result of a chain reaction that starts in the immune system. Your immune system controls how your body defends itself. For instance, if you have an allergy to pollen, your immune system identifies pollen as an invader or allergen. Your immune system overreacts by producing antibodies called Immunoglobulin E (IgE). These antibodies travel to cells that release chemicals, causing an allergic reaction.  The concept behind allergy immunotherapy, whether it is received in the form of shots or tablets, is that the immune system can be desensitized to specific allergens that trigger allergy symptoms. Although it requires time and patience, the payback can be long-term relief.  How Do Allergy Shots Work?  Allergy shots work much like a vaccine. Your body responds to injected amounts of a particular allergen given in increasing doses, eventually developing a resistance and tolerance to it. Allergy shots can lead to decreased, minimal or no allergy symptoms.  There generally are two phases: build-up and maintenance. Build-up often ranges from three to six months and involves receiving injections with increasing amounts of the allergens. The shots are typically given once or twice a week, though more rapid build-up schedules are sometimes used.  The maintenance phase begins when the most effective dose is reached. This dose is different for each person, depending on how allergic you are and your  response to the build-up injections. Once the maintenance dose is reached, there are longer periods between injections, typically two to four weeks.  Occasionally doctors give cortisone-type shots that can temporarily reduce allergy symptoms. These types of shots are different and should not be confused with allergy immunotherapy shots.  Who Can Be Treated with Allergy Shots?  Allergy shots may be a good treatment approach for people with allergic rhinitis (hay fever), allergic asthma, conjunctivitis (eye allergy) or stinging insect allergy.   Before deciding to begin allergy shots, you should consider:   The length of allergy season and the severity of your symptoms  Whether medications and/or changes to your environment can control your symptoms  Your desire to avoid long-term medication use  Time: allergy immunotherapy requires a major time commitment  Cost: may vary depending on your insurance coverage  Allergy shots for children age 59 and older are effective and often well tolerated. They might prevent the onset of new allergen sensitivities or the progression to asthma.  Allergy shots are not started on patients who are pregnant but can be continued on patients who become pregnant while receiving them. In some patients with other medical conditions or who take certain common medications, allergy shots may be of risk. It is important to mention other medications you talk to your allergist.   When Will I Feel Better?  Some may experience decreased allergy symptoms during the build-up phase. For others, it may take as long as 12 months on the maintenance dose. If there is no improvement after a year of maintenance, your allergist  will discuss other treatment options with you.  If you arent responding to allergy shots, it may be because there is not enough dose of the allergen in your vaccine or there are missing allergens that were not identified during your allergy testing. Other  reasons could be that there are high levels of the allergen in your environment or major exposure to non-allergic triggers like tobacco smoke.  What Is the Length of Treatment?  Once the maintenance dose is reached, allergy shots are generally continued for three to five years. The decision to stop should be discussed with your allergist at that time. Some people may experience a permanent reduction of allergy symptoms. Others may relapse and a longer course of allergy shots can be considered.  What Are the Possible Reactions?  The two types of adverse reactions that can occur with allergy shots are local and systemic. Common local reactions include very mild redness and swelling at the injection site, which can happen immediately or several hours after. A systemic reaction, which is less common, affects the entire body or a particular body system. They are usually mild and typically respond quickly to medications. Signs include increased allergy symptoms such as sneezing, a stuffy nose or hives.  Rarely, a serious systemic reaction called anaphylaxis can develop. Symptoms include swelling in the throat, wheezing, a feeling of tightness in the chest, nausea or dizziness. Most serious systemic reactions develop within 30 minutes of allergy shots. This is why it is strongly recommended you wait in your doctors office for 30 minutes after your injections. Your allergist is trained to watch for reactions, and his or her staff is trained and equipped with the proper medications to identify and treat them.  Who Should Administer Allergy Shots?  The preferred location for receiving shots is your prescribing allergists office. Injections can sometimes be given at another facility where the physician and staff are trained to recognize and treat reactions, and have received instructions by your prescribing allergist.

## 2021-10-05 ENCOUNTER — Ambulatory Visit: Payer: Commercial Managed Care - PPO | Admitting: Allergy & Immunology

## 2021-10-26 ENCOUNTER — Ambulatory Visit: Payer: Commercial Managed Care - PPO | Admitting: Allergy & Immunology

## 2021-11-08 ENCOUNTER — Emergency Department (HOSPITAL_BASED_OUTPATIENT_CLINIC_OR_DEPARTMENT_OTHER)
Admission: EM | Admit: 2021-11-08 | Discharge: 2021-11-08 | Disposition: A | Discharge status: Home / Self Care | Payer: Commercial Managed Care - PPO | Attending: Emergency Medicine | Admitting: Emergency Medicine

## 2021-11-08 ENCOUNTER — Emergency Department (HOSPITAL_BASED_OUTPATIENT_CLINIC_OR_DEPARTMENT_OTHER): Payer: Commercial Managed Care - PPO | Admitting: Radiology

## 2021-11-08 ENCOUNTER — Encounter (HOSPITAL_BASED_OUTPATIENT_CLINIC_OR_DEPARTMENT_OTHER): Payer: Self-pay | Admitting: Obstetrics and Gynecology

## 2021-11-08 ENCOUNTER — Other Ambulatory Visit: Payer: Self-pay

## 2021-11-08 DIAGNOSIS — R202 Paresthesia of skin: Secondary | ICD-10-CM | POA: Diagnosis not present

## 2021-11-08 DIAGNOSIS — D649 Anemia, unspecified: Secondary | ICD-10-CM | POA: Diagnosis not present

## 2021-11-08 DIAGNOSIS — R079 Chest pain, unspecified: Secondary | ICD-10-CM | POA: Insufficient documentation

## 2021-11-08 DIAGNOSIS — E039 Hypothyroidism, unspecified: Secondary | ICD-10-CM | POA: Diagnosis not present

## 2021-11-08 LAB — CBC
HCT: 39.9 % (ref 36.0–46.0)
Hemoglobin: 12.9 g/dL (ref 12.0–15.0)
MCH: 26.6 pg (ref 26.0–34.0)
MCHC: 32.3 g/dL (ref 30.0–36.0)
MCV: 82.3 fL (ref 80.0–100.0)
Platelets: 305 10*3/uL (ref 150–400)
RBC: 4.85 MIL/uL (ref 3.87–5.11)
RDW: 14 % (ref 11.5–15.5)
WBC: 7.3 10*3/uL (ref 4.0–10.5)
nRBC: 0 % (ref 0.0–0.2)

## 2021-11-08 LAB — BASIC METABOLIC PANEL
Anion gap: 11 (ref 5–15)
BUN: 15 mg/dL (ref 6–20)
CO2: 24 mmol/L (ref 22–32)
Calcium: 9.2 mg/dL (ref 8.9–10.3)
Chloride: 105 mmol/L (ref 98–111)
Creatinine, Ser: 0.97 mg/dL (ref 0.44–1.00)
GFR, Estimated: >60 mL/min (ref >60)
Glucose, Bld: 89 mg/dL (ref 70–99)
Potassium: 3.5 mmol/L (ref 3.5–5.1)
Sodium: 140 mmol/L (ref 135–145)

## 2021-11-08 LAB — TROPONIN I (HIGH SENSITIVITY)
Troponin I (High Sensitivity): 5 ng/L (ref <18)
Troponin I (High Sensitivity): 5 ng/L (ref <18)

## 2021-11-08 LAB — PREGNANCY, URINE: Preg Test, Ur: NEGATIVE

## 2021-11-08 MED ORDER — ALUM & MAG HYDROXIDE-SIMETH 200-200-20 MG/5ML PO SUSP
15.0000 mL | Freq: Once | ORAL | Status: AC
  Administered 2021-11-08: 15 mL via ORAL
  Filled 2021-11-08: qty 30

## 2021-11-08 MED ORDER — ASPIRIN 325 MG PO TABS
325.0000 mg | ORAL_TABLET | Freq: Every day | ORAL | Status: DC
  Administered 2021-11-08: 325 mg via ORAL
  Filled 2021-11-08: qty 1

## 2021-11-08 NOTE — ED Notes (Signed)
IV removed from pt's left AC. IV site was clean, dry, and intact. 

## 2021-11-08 NOTE — ED Notes (Signed)
Discharge paperwork given and understood. 

## 2021-11-08 NOTE — Discharge Instructions (Signed)
Follow up with primary for re-evaluation later this week. ?Call cardiology to schedule an appointment outpatient. ?Return if s ymptoms change or worsen.  ?

## 2021-11-08 NOTE — ED Triage Notes (Signed)
Patient reports to the ER for chest pain that started 1.5 hours ago. Reports it feels sharp on the left side and travels down her arm. Patient denies nausea or emesis.  ?

## 2021-11-08 NOTE — ED Provider Notes (Signed)
?[facility] ?Provider Note ? ? ?CSN: [id number] ?Arrival date & time: 11/08/21  1156 ? ?  ? ?History ? ?Chief Complaint  ?Patient presents with  ? Chest Pain  ? ? ?Nichole Beck is a 51 y.o. female. ? ? ?Chest Pain ? ?Patient with medical history notable for nicotine dependence, hypothyroidism, anemia presents today due to chest pain.  Started about 2 and half hours ago, its intermittent.  It feels sharp and it is on the left side of her chest with radiation to her left arms.  She feels like there is a tingling in her left fingertips, no shortness of breath nausea or vomiting.  She has not had anything for the pain, unable to identify any aggravating factors.  No history of previous MIs or PEs. ? ?Home Medications ?Prior to Admission medications   ?Medication Sig Start Date End Date Taking? Authorizing Provider  ?azelastine (ASTELIN) 0.1 % nasal spray Place 2 sprays into both nostrils 2 (two) times daily as needed for rhinitis. Use in each nostril as directed 09/09/21   Alfonse Spruce, MD  ?fexofenadine [facility] ALLERGY) 180 MG tablet Take 1 tablet (180 mg total) by mouth daily. 09/09/21 10/09/21  Alfonse Spruce, MD  ?ipratropium (ATROVENT) 0.03 % nasal spray Place 2 sprays into both nostrils 3 (three) times daily. 09/09/21   Alfonse Spruce, MD  ?levothyroxine (SYNTHROID) 100 MCG tablet Take 1 tablet (100 mcg total) by mouth daily. 04/09/21   Philip Aspen, Limmie Patricia, MD  ?triamcinolone (NASACORT) 55 MCG/ACT AERO nasal inhaler Place 2 sprays into the nose daily. 09/09/21   Alfonse Spruce, MD  ?   ? ?Allergies    ?Patient has no known allergies.   ? ?Review of Systems   ?Review of Systems  ?Cardiovascular:  Positive for chest pain.  ? ?Physical Exam ?Updated Vital Signs ?BP (!) 135/98 (BP Location: Right Arm)   Pulse 70   Temp 98.5 ?F (36.9 ?C)   Resp 17   Ht 5\' 7"  (1.702 m)   Wt 65.8 kg   LMP  (LMP Unknown)   SpO2 100%   BMI 22.71 kg/m?  ?Physical Exam ?Vitals  and nursing note reviewed. Exam conducted with a chaperone present.  ?Constitutional:   ?   Appearance: Normal appearance.  ?HENT:  ?   Head: Normocephalic and atraumatic.  ?Eyes:  ?   General: No scleral icterus.    ?   Right eye: No discharge.     ?   Left eye: No discharge.  ?   Extraocular Movements: Extraocular movements intact.  ?   Pupils: Pupils are equal, round, and reactive to light.  ?Cardiovascular:  ?   Rate and Rhythm: Normal rate and regular rhythm.  ?   Pulses: Normal pulses.  ?   Heart sounds: Normal heart sounds. No murmur heard. ?  No friction rub. No gallop.  ?Pulmonary:  ?   Effort: Pulmonary effort is normal. No respiratory distress.  ?   Breath sounds: Normal breath sounds.  ?Chest:  ?   Chest wall: Tenderness present.  ?   Comments: Pinpoint reproducible tenderness to the left chest wall under the second costal space ?Abdominal:  ?   General: Abdomen is flat. Bowel sounds are normal. There is no distension.  ?   Palpations: Abdomen is soft.  ?   Tenderness: There is no abdominal tenderness.  ?Skin: ?   General: Skin is warm and dry.  ?   Coloration: Skin is  not jaundiced.  ?Neurological:  ?   Mental Status: She is alert. Mental status is at baseline.  ?   Coordination: Coordination normal.  ? ? ?ED Results / Procedures / Treatments   ?Labs ?(all labs ordered are listed, but only abnormal results are displayed) ?Labs Reviewed  ?BASIC METABOLIC PANEL  ?CBC  ?PREGNANCY, URINE  ?TROPONIN I (HIGH SENSITIVITY)  ?TROPONIN I (HIGH SENSITIVITY)  ? ? ?EKG ?None ? ?Radiology ?DG Chest 2 View ? ?Result Date: 11/08/2021 ?CLINICAL DATA:  Chest pain EXAM: CHEST - 2 VIEW COMPARISON:  08/02/2021 FINDINGS: The heart size and mediastinal contours are within normal limits. Both lungs are clear. The visualized skeletal structures are unremarkable. IMPRESSION: No active cardiopulmonary disease. Electronically Signed   By: Ernie Avena M.D.   On: 11/08/2021 12:36   ? ?Procedures ?Procedures  ? ? ?Medications  Ordered in ED ?Medications  ?aspirin tablet 325 mg (325 mg Oral Given 11/08/21 1538)  ?alum & mag hydroxide-simeth (MAALOX/MYLANTA) 200-200-20 MG/5ML suspension 15 mL (15 mLs Oral Given 11/08/21 1539)  ? ? ?ED Course/ Medical Decision Making/ A&P ?  ?                        ?Medical Decision Making ?Amount and/or Complexity of Data Reviewed ?Labs: ordered. ?Radiology: ordered. ? ?Risk ?OTC drugs. ? ? ?This patient presents to the ED for concern of chest pain, this involves an extensive number of treatment options, and is a complaint that carries with it a high risk of complications and morbidity.  The differential diagnosis includes but not limited to: ACS, PE, costochondritis, GERD, pericarditis ? ?Patient?s presentation is complicated by their history of nicotine dependence.  ? ? ?Additional history obtained:  ? ?Reviewed patient's medical record, no history of previous MI.  ? ?  ?Lab Tests: ? ?I ordered, viewed, and personally interpreted labs.  The pertinent results include:   ?-CBC no leukocytosis or anemia ?-BMP no AKI, no gross electrolyte derangement ?-Negative delta troponin  ? ?  ?Imaging Studies ordered: ? ?I directly visualized the chest xray, which showed no acute process ? ?I agree with the radiologist interpretation ?  ? ?ECG/Cardiac monitoring:  ? ?Per my interpretation, EKG shows NSR, no TWI ? ?The patient was maintained on a cardiac monitor.  Visualized monitor strip which showed NSR, HR 80s per my interpretation.  ? ? ?Medicines ordered and prescription drug management: ? ?I ordered medication including: Maalox, aspirin   ? ?I have reviewed the patients home medicines and have made adjustments as needed ? ? ?Test Considered: ? ?Low risk for ACS given heart score 2.  ? ?Discussed options for workup with patient, risks and benefits, via shared decision making decided to forgo additional imaging or observation at this time. ? ? ? ?Reevaluation: ? ?After the interventions noted above, I reevaluated the  patient and found patient relaxing comfortably, no changes on the cardiac monitor and ovary occurrence of her pain. ? ? ?Problems addressed / ED Course: ?-chest pain - negative delta troponin, EKG without ischemic findings. PE notable for reproducible tenderness, likely MSK. Will D/C with cardiology referral.  ?  ? ?Social Determinants of Health: ?Has a primary care provider ?  ?Disposition: ? ? ?After consideration of the diagnostic results and the patients response to treatment, I feel that the patent would benefit from outpatient cardiology F/U. ? ?  ? ? ? ? ? ? ? ? ?Final Clinical Impression(s) / ED Diagnoses ?Final diagnoses:  ?Chest  pain, unspecified type  ? ? ?Rx / DC Orders ?ED Discharge Orders   ? ? None  ? ?  ? ? ?  ?Theron AristaSage, Garen Woolbright, PA-C ?11/08/21 2044 ? ?  ?Cheryll CockayneHong, Joshua S, MD ?11/08/21 2323 ? ?

## 2021-11-15 ENCOUNTER — Other Ambulatory Visit: Payer: Self-pay

## 2021-11-15 ENCOUNTER — Encounter (HOSPITAL_BASED_OUTPATIENT_CLINIC_OR_DEPARTMENT_OTHER): Payer: Self-pay

## 2021-11-15 DIAGNOSIS — S161XXA Strain of muscle, fascia and tendon at neck level, initial encounter: Secondary | ICD-10-CM | POA: Diagnosis not present

## 2021-11-15 DIAGNOSIS — Y92410 Unspecified street and highway as the place of occurrence of the external cause: Secondary | ICD-10-CM | POA: Insufficient documentation

## 2021-11-15 DIAGNOSIS — S199XXA Unspecified injury of neck, initial encounter: Secondary | ICD-10-CM | POA: Diagnosis present

## 2021-11-15 DIAGNOSIS — S39012A Strain of muscle, fascia and tendon of lower back, initial encounter: Secondary | ICD-10-CM | POA: Diagnosis not present

## 2021-11-15 DIAGNOSIS — R519 Headache, unspecified: Secondary | ICD-10-CM | POA: Insufficient documentation
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
## 2021-11-16 ENCOUNTER — Emergency Department (HOSPITAL_BASED_OUTPATIENT_CLINIC_OR_DEPARTMENT_OTHER)
Admission: EM | Admit: 2021-11-16 | Discharge: 2021-11-16 | Disposition: A | Discharge status: Home / Self Care | Payer: Commercial Managed Care - PPO | Attending: Emergency Medicine | Admitting: Emergency Medicine

## 2021-11-16 DIAGNOSIS — S161XXA Strain of muscle, fascia and tendon at neck level, initial encounter: Secondary | ICD-10-CM

## 2021-11-16 DIAGNOSIS — S39012A Strain of muscle, fascia and tendon of lower back, initial encounter: Secondary | ICD-10-CM

## 2021-11-16 MED ORDER — NAPROXEN 250 MG PO TABS
500.0000 mg | ORAL_TABLET | Freq: Once | ORAL | Status: AC
  Administered 2021-11-16: 500 mg via ORAL
  Filled 2021-11-16: qty 2

## 2021-11-16 MED ORDER — NAPROXEN 500 MG PO TABS: 500.0000 mg | ORAL_TABLET | Freq: Two times a day (BID) | ORAL | 0 refills | Status: DC

## 2021-11-16 MED ORDER — METHOCARBAMOL 500 MG PO TABS: 500.0000 mg | ORAL_TABLET | Freq: Two times a day (BID) | ORAL | 0 refills | Status: DC

## 2021-11-16 NOTE — ED Notes (Signed)
Pt verbalizes understanding of discharge instructions. Opportunity for questioning and answers were provided. Pt discharged from ED to home.   ? ?
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
## 2021-11-18 ENCOUNTER — Ambulatory Visit: Payer: Commercial Managed Care - PPO | Admitting: Allergy & Immunology

## 2021-12-23 ENCOUNTER — Ambulatory Visit (INDEPENDENT_AMBULATORY_CARE_PROVIDER_SITE_OTHER): Payer: Commercial Managed Care - PPO | Admitting: Allergy & Immunology

## 2021-12-23 ENCOUNTER — Encounter: Payer: Self-pay | Admitting: Allergy & Immunology

## 2021-12-23 ENCOUNTER — Other Ambulatory Visit: Payer: Self-pay

## 2021-12-23 VITALS — BP 116/80 | HR 71 | Temp 98.1°F | Resp 16

## 2021-12-23 DIAGNOSIS — J3089 Other allergic rhinitis: Secondary | ICD-10-CM

## 2021-12-23 DIAGNOSIS — R1319 Other dysphagia: Secondary | ICD-10-CM

## 2021-12-23 DIAGNOSIS — J302 Other seasonal allergic rhinitis: Secondary | ICD-10-CM

## 2021-12-23 DIAGNOSIS — J0100 Acute maxillary sinusitis, unspecified: Secondary | ICD-10-CM | POA: Diagnosis not present

## 2021-12-23 MED ORDER — IPRATROPIUM BROMIDE 0.03 % NA SOLN: 2.0000 | Freq: Three times a day (TID) | NASAL | 5 refills | Status: DC

## 2021-12-23 MED ORDER — XHANCE 93 MCG/ACT NA EXHU
1.0000 | INHALANT_SUSPENSION | Freq: Two times a day (BID) | NASAL | 5 refills | Status: DC
Start: 2021-12-23 — End: 2021-12-31

## 2021-12-23 MED ORDER — MONTELUKAST SODIUM 10 MG PO TABS: 10.0000 mg | ORAL_TABLET | Freq: Every day | ORAL | 5 refills | Status: DC

## 2021-12-23 MED ORDER — AMOXICILLIN-POT CLAVULANATE 875-125 MG PO TABS: 1.0000 | ORAL_TABLET | Freq: Two times a day (BID) | ORAL | 0 refills | Status: AC

## 2021-12-23 NOTE — Patient Instructions (Addendum)
1. Seasonal and perennial allergic rhinitis (grasses, ragweed, weeds, trees, dust mites, cat, dog, cockroach, and tobacco) - We are going to make some changes to try to get this under better control.  - Your smoking is likely contributing to this (especially with tobacco as an allergen), but let's work on getting these symptoms under better control before we work on smoking cessation.  - Stop taking: Nasacort, Astelin, and Allegra - Continue taking: Atrovent (ipratropium) 0.03% one spray per nostril 2-3 times daily as needed (CAN BE OVER DRYING) - Start taking: Xhance one spray per nostril TWICE DAILY (sample provided) and Singulair (montelukast). - Montelukast can rarely cause irritability and bad dreams, so be aware of this. - We are also going to treat you for sinusitis with a prednisone taper as well as as Augmentin 875 mg twice daily for 10 days. - If you are not feeling better after this, we will order a sinus CT to get a more detailed look at your nasal cavity. - Strongly consider allergy shots for long-term control. - We are offering rush immunotherapy or you can finish 3 vials with 1 visit, although it takes nearly all day. - However rash immunotherapy would say 3 to 4 months of weekly visits to get you closer to your maintenance dose, which is most effective.  2. Dysphagia (difficulty swallowing) - I think this is all due to the postnasal drip that is not under good control. - It seems that you have seen a lot of other specialists as well. - We will hold off on any further referrals at this point in time.  3. Return in about 6 weeks (around 02/03/2022).    Please inform us of any Emergency Department visits, hospitalizations, or changes in symptoms. Call us before going to the ED for breathing or allergy symptoms since we might be able to fit you in for a sick visit. Feel free to contact us anytime with any questions, problems, or concerns.  It was a pleasure to meet you  today!  Websites that have reliable patient information: 1. American Academy of Asthma, Allergy, and Immunology: www.aaaai.org 2. Food Allergy Research and Education (FARE): foodallergy.org 3. Mothers of Asthmatics: http://www.asthmacommunitynetwork.org 4. American College of Allergy, Asthma, and Immunology: www.acaai.org   COVID-19 Vaccine Information can be found at: [email or web address] For questions related to vaccine distribution or appointments, please email [email or web address] or call [phone].   We realize that you might be concerned about having an allergic reaction to the COVID19 vaccines. To help with that concern, WE ARE OFFERING THE COVID19 VACCINES IN OUR OFFICE! Ask the front desk for dates!     "Like" [place] on Facebook and Instagram for our latest updates!      A healthy democracy works best when [employer] participate! Make sure you are registered to vote! If you have moved or changed any of your contact information, you will need to get this updated before voting!  In some cases, you MAY be able to register to vote online: [email or web address]       Allergy Shots   Allergies are the result of a chain reaction that starts in the immune system. Your immune system controls how your body defends itself. For instance, if you have an allergy to pollen, your immune system identifies pollen as an invader or allergen. Your immune system overreacts by producing antibodies called Immunoglobulin E (IgE). These antibodies travel to cells that release chemicals, causing an allergic reaction.  The concept behind allergy immunotherapy, whether it is  received in the form of shots or tablets, is that the immune system can be desensitized to specific allergens that trigger allergy symptoms. Although it requires time and patience, the payback can be long-term relief.  How Do Allergy Shots  Work?  Allergy shots work much like a vaccine. Your body responds to injected amounts of a particular allergen given in increasing doses, eventually developing a resistance and tolerance to it. Allergy shots can lead to decreased, minimal or no allergy symptoms.  There generally are two phases: build-up and maintenance. Build-up often ranges from three to six months and involves receiving injections with increasing amounts of the allergens. The shots are typically given once or twice a week, though more rapid build-up schedules are sometimes used.  The maintenance phase begins when the most effective dose is reached. This dose is different for each person, depending on how allergic you are and your response to the build-up injections. Once the maintenance dose is reached, there are longer periods between injections, typically two to four weeks.  Occasionally doctors give cortisone-type shots that can temporarily reduce allergy symptoms. These types of shots are different and should not be confused with allergy immunotherapy shots.  Who Can Be Treated with Allergy Shots?  Allergy shots may be a good treatment approach for people with allergic rhinitis (hay fever), allergic asthma, conjunctivitis (eye allergy) or stinging insect allergy.   Before deciding to begin allergy shots, you should consider:   The length of allergy season and the severity of your symptoms  Whether medications and/or changes to your environment can control your symptoms  Your desire to avoid long-term medication use  Time: allergy immunotherapy requires a major time commitment  Cost: may vary depending on your insurance coverage  Allergy shots for children age 44five and older are effective and often well tolerated. They might prevent the onset of new allergen sensitivities or the progression to asthma.  Allergy shots are not started on patients who are pregnant but can be continued on patients who become pregnant while  receiving them. In some patients with other medical conditions or who take certain common medications, allergy shots may be of risk. It is important to mention other medications you talk to your allergist.   When Will I Feel Better?  Some may experience decreased allergy symptoms during the build-up phase. For others, it may take as long as 12 months on the maintenance dose. If there is no improvement after a year of maintenance, your allergist will discuss other treatment options with you.  If you aren't responding to allergy shots, it may be because there is not enough dose of the allergen in your vaccine or there are missing allergens that were not identified during your allergy testing. Other reasons could be that there are high levels of the allergen in your environment or major exposure to non-allergic triggers like tobacco smoke.  What Is the Length of Treatment?  Once the maintenance dose is reached, allergy shots are generally continued for three to five years. The decision to stop should be discussed with your allergist at that time. Some people may experience a permanent reduction of allergy symptoms. Others may relapse and a longer course of allergy shots can be considered.  What Are the Possible Reactions?  The two types of adverse reactions that can occur with allergy shots are local and systemic. Common local reactions include very mild redness and swelling at the injection site, which can happen immediately or several hours after. A systemic  reaction, which is less common, affects the entire body or a particular body system. They are usually mild and typically respond quickly to medications. Signs include increased allergy symptoms such as sneezing, a stuffy nose or hives.  Rarely, a serious systemic reaction called anaphylaxis can develop. Symptoms include swelling in the throat, wheezing, a feeling of tightness in the chest, nausea or dizziness. Most serious systemic reactions  develop within 30 minutes of allergy shots. This is why it is strongly recommended you wait in your doctor's office for 30 minutes after your injections. Your allergist is trained to watch for reactions, and his or her staff is trained and equipped with the proper medications to identify and treat them.  Who Should Administer Allergy Shots?  The preferred location for receiving shots is your prescribing allergist's office. Injections can sometimes be given at another facility where the physician and staff are trained to recognize and treat reactions, and have received instructions by your prescribing allergist.

## 2021-12-23 NOTE — Progress Notes (Signed)
FOLLOW UP  Date of Service/Encounter:  12/23/21   Assessment:   Seasonal and perennial allergic rhinitis (grasses, ragweed, weeds, trees, dust mites, cat, dog, cockroach, and tobacco) - uncontrolled at this point  Acute sinusitis - treating with Augmentin   Dysphagia - with normal GI and ENT workup    Plan/Recommendations:   1. Seasonal and perennial allergic rhinitis (grasses, ragweed, weeds, trees, dust mites, cat, dog, cockroach, and tobacco) - We are going to make some changes to try to get this under better control.  - Your smoking is likely contributing to this (especially with tobacco as an allergen), but let's work on getting these symptoms under better control before we work on smoking cessation.  - Stop taking: Nasacort, Astelin, and Allegra - Continue taking: Atrovent (ipratropium) 0.03% one spray per nostril 2-3 times daily as needed (CAN BE OVER DRYING) - Start taking: Xhance one spray per nostril TWICE DAILY (sample provided) and Singulair (montelukast). - Montelukast can rarely cause irritability and bad dreams, so be aware of this. - We are also going to treat you for sinusitis with a prednisone taper as well as as Augmentin 875 mg twice daily for 10 days. - If you are not feeling better after this, we will order a sinus CT to get a more detailed look at your nasal cavity. - Strongly consider allergy shots for long-term control. - We are offering rush immunotherapy or you can finish 3 vials with 1 visit, although it takes nearly all day. - However rush immunotherapy would save 3 to 4 months of weekly visits to get you closer to your maintenance dose, which is most effective.  2. Dysphagia (difficulty swallowing) - I think this is all due to the postnasal drip that is not under good control. - It seems that you have seen a lot of other specialists as well. - We will hold off on any further referrals at this point in time.  3. Return in about 6 weeks (around  02/03/2022).    Subjective:   Nichole Beck is a 51 y.o. female presenting today for follow up of  Chief Complaint  Patient presents with   Allergic Rhinitis     Nichole PurlShyrene Canizares has a history of the following: Patient Active Problem List   Diagnosis Date Noted   Bilateral hand pain 05/31/2021   Multiple thyroid nodules 05/25/2021   Dysphagia 03/25/2021   Hypothyroidism     History obtained from: chart review and patient.  Nichole Beck is a 51 y.o. female presenting for a follow up visit.  She was last seen in February 2020 patient.  At that time, she underwent testing that was positive to grasses, ragweed, trees, weeds, dust mites, cat, dog, cockroach, and tobacco.  We stopped all of her medicines and started Allegra, Nasacort and Astelin in combination with Atrovent as needed.  For her dysphagia, we felt that this was secondary to the postnasal drip.  She was already seeing gastroenterology.  Since the last visit, she has continued to have problems. She tried using the Astelin but she did not like the taste and it made her gag more. She has been using the Flonase but has not felt that this helped at all. She has using her antihistamine with minimal improvement. She continues to have a lot of postnasal drip and mucous. She describes her mucous as stringy and able to "stick paper to the wall". Her cough continues to be a problem.   She does report some chest tightness, but  this is only a problem when the mucous is particular thick and copious. She denies ever having had wheezing. She has never had issues with coughing and wheezing and requiring an inhaler. She has never needed steroids at all. She is unsure about doing allergy shots.   Work is going well. 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   Otherwise, there have been no changes to her past medical history, surgical history, family history, or social  history.    Review of Systems  Constitutional: Negative.  Negative for fever, malaise/fatigue and weight loss.  HENT:  Positive for congestion. Negative for ear discharge and ear pain.        Positive for postnasal drip.  Eyes:  Negative for pain, discharge and redness.  Respiratory:  Positive for cough and sputum production. Negative for shortness of breath and wheezing.   Cardiovascular: Negative.  Negative for chest pain and palpitations.  Gastrointestinal:  Negative for abdominal pain, constipation, diarrhea, heartburn, nausea and vomiting.  Skin: Negative.  Negative for itching and rash.  Neurological:  Negative for dizziness and headaches.  Endo/Heme/Allergies:  Negative for environmental allergies. Does not bruise/bleed easily.      Objective:   Blood pressure 116/80, pulse 71, temperature 98.1 F (36.7 C), temperature source Temporal, resp. rate 16, SpO2 100 %. There is no height or weight on file to calculate BMI.    Physical Exam Vitals reviewed.  Constitutional:      Appearance: She is well-developed.  HENT:     Head: Normocephalic and atraumatic.     Right Ear: Tympanic membrane, ear canal and external ear normal. No drainage, swelling or tenderness. Tympanic membrane is not injected, scarred, erythematous, retracted or bulging.     Left Ear: Tympanic membrane, ear canal and external ear normal. No drainage, swelling or tenderness. Tympanic membrane is not injected, scarred, erythematous, retracted or bulging.     Nose: Mucosal edema and rhinorrhea present. No nasal deformity or septal deviation.     Right Turbinates: Enlarged, swollen and pale.     Left Turbinates: Enlarged, swollen and pale.     Right Sinus: No maxillary sinus tenderness or frontal sinus tenderness.     Left Sinus: No maxillary sinus tenderness or frontal sinus tenderness.     Comments: No nasal polyps. Clear rhinorrhea.     Mouth/Throat:     Mouth: Mucous membranes are not pale and not dry.      Pharynx: Uvula midline.     Comments: Cobblestoning present in the posterior oropharynx. Tonsils normally sized bilaterally. Eyes:     General: Lids are normal. Allergic shiner present.        Right eye: No discharge.        Left eye: No discharge.     Conjunctiva/sclera: Conjunctivae normal.     Right eye: Right conjunctiva is not injected. No chemosis.    Left eye: Left conjunctiva is not injected. No chemosis.    Pupils: Pupils are equal, round, and reactive to light.  Cardiovascular:     Rate and Rhythm: Normal rate and regular rhythm.     Heart sounds: Normal heart sounds.  Pulmonary:     Effort: Pulmonary effort is normal. No tachypnea, accessory muscle usage or respiratory distress.     Breath sounds: Normal breath sounds. No wheezing, rhonchi or rales.     Comments: Moving air well in all lung fields.  No increased work of breathing noted.  Chest:     Chest wall: No tenderness.  Abdominal:     Tenderness: There is no abdominal tenderness. There is no guarding or rebound.  Lymphadenopathy:     Head:     Right side of head: No submandibular, tonsillar or occipital adenopathy.     Left side of head: No submandibular, tonsillar or occipital adenopathy.     Cervical: No cervical adenopathy.  Skin:    General: Skin is warm.     Capillary Refill: Capillary refill takes less than 2 seconds.     Coloration: Skin is not pale.     Findings: No abrasion, erythema, petechiae or rash. Rash is not papular, urticarial or vesicular.     Comments: No eczematous or urticarial lesions noted.  Neurological:     Mental Status: She is alert.  Psychiatric:        Behavior: Behavior is cooperative.     Diagnostic studies: none     Malachi Bonds, MD  [facility]

## 2021-12-25 ENCOUNTER — Encounter: Payer: Self-pay | Admitting: Allergy & Immunology

## 2021-12-31 MED ORDER — XHANCE 93 MCG/ACT NA EXHU: 1.0000 | INHALANT_SUSPENSION | Freq: Two times a day (BID) | NASAL | 5 refills | Status: DC

## 2021-12-31 NOTE — Addendum Note (Signed)
Addended by: Orson Aloe on: 12/31/2021 09:15 AM   Modules accepted: Orders

## 2022-01-14 ENCOUNTER — Telehealth: Payer: Self-pay

## 2022-01-14 NOTE — Telephone Encounter (Signed)
Pa submitted thru cover my meds for xhance waiting on response key [id number]

## 2022-02-08 ENCOUNTER — Encounter: Payer: Self-pay | Admitting: Allergy & Immunology

## 2022-02-08 ENCOUNTER — Ambulatory Visit (INDEPENDENT_AMBULATORY_CARE_PROVIDER_SITE_OTHER): Payer: Commercial Managed Care - PPO | Admitting: Allergy & Immunology

## 2022-02-08 VITALS — BP 110/76 | HR 84 | Temp 98.3°F | Resp 16

## 2022-02-08 DIAGNOSIS — R1319 Other dysphagia: Secondary | ICD-10-CM

## 2022-02-08 DIAGNOSIS — R0981 Nasal congestion: Secondary | ICD-10-CM

## 2022-02-08 DIAGNOSIS — J341 Cyst and mucocele of nose and nasal sinus: Secondary | ICD-10-CM | POA: Diagnosis not present

## 2022-02-08 DIAGNOSIS — J3089 Other allergic rhinitis: Secondary | ICD-10-CM | POA: Diagnosis not present

## 2022-02-08 DIAGNOSIS — J302 Other seasonal allergic rhinitis: Secondary | ICD-10-CM

## 2022-02-08 MED ORDER — MONTELUKAST SODIUM 10 MG PO TABS
10.0000 mg | ORAL_TABLET | Freq: Every day | ORAL | 5 refills | Status: DC
Start: 2022-02-08 — End: 2022-05-10

## 2022-02-08 MED ORDER — AZELASTINE HCL 0.1 % NA SOLN
2.0000 | Freq: Two times a day (BID) | NASAL | 5 refills | Status: DC | PRN
Start: 2022-02-08 — End: 2022-02-08

## 2022-02-08 NOTE — Progress Notes (Signed)
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  I think we seem to add seems to help at all.  The ipratropium is the only thing that might help a little.  I am not convinced that the sinus retention cyst has anything to do with all of the symptoms she is experiencing, but we will order a CT to follow-up on this and we will refer to Dr. Suszanne Beck for second opinion.    Plan/Recommendations:   1. Seasonal and perennial allergic rhinitis (grasses, ragweed, weeds, trees, dust mites, cat, dog, cockroach, and tobacco) - We are going to send in montelukast to your regular pharmacy.  - Continue taking: Atrovent (ipratropium) 0.03% one spray per nostril 2-3 times daily as needed (CAN BE OVER DRYING) - We are going to order a sinus CT for further evaluation of this retention cyst. - We will send you to see Dr. Suszanne Beck for a second opinion.  - Strongly consider allergy shots for long-term control. - We are offering rush immunotherapy or you can finish 3 vials with 1 visit, although it takes nearly all day. - However rash immunotherapy would save 3 to 4 months of weekly visits to get you closer to your maintenance dose, which is most effective. - Let us know about the FMLA paperwork.  2. Return in about 3 months (around 05/11/2022).   Subjective:   Nichole Beck is a 51 y.o. female presenting today for follow up of  Chief Complaint  Patient presents with   Follow-up   Allergies    Nichole Beck has a history of the following: Patient Active Problem List   Diagnosis Date Noted   Bilateral hand pain 05/31/2021    Multiple thyroid nodules 05/25/2021   Dysphagia 03/25/2021   Hypothyroidism     History obtained from: chart review and patient.  Nichole Beck is a 51 y.o. female presenting for a follow up visit.  She was last seen in May 2023.  At that time, we made some changes to her allergen immunotherapy to try to get things under better control.  She was smoking which I felt was contributing to her rhinitis symptoms.  We stopped her Nasacort, Astelin, and Allegra.  We continued Atrovent and started Palmer as well as Singulair.  For her dysphagia, we felt the postnasal drip was contributing to this.  We held off on further referrals at that time.  Since the last visit, she has mostly done well.   Allergic Rhinitis Symptom History: The Timmothy Sours was too expensive. She  decided not to use it at all. She remains on the Atrovent for this.  She did get a sample of the [place] and she did not feel good about coordinating all of this. She is only on the Atrovent and not the Astelin. She never got the montelukast since we sent it to the wrong pharmacy. We are going to refill it as well.   She did get prednisone but that did not seem to do anything at all. She continues to report swallowing issues and dryness in her throat and nose. She denies headaches or migraines. She does report some  vision changes and "feels like she is going blind". She does see an eye doctor and saw him last a few months ago.   She also continues to endorse symptoms of difficulty swallowing and throat clearing. She does see Dr. Tomasa Beck gastroenterology and she had a barium swallow and endoscopy.  This was all reportedly normal.  She has episodes often. She is wondering about FMLA. Episodes last around a day or so.  She will go back to sleep when they happen and it is much better when she wakes up.  She is worried about getting too many points at work due to her call outs.  Wearing a mask at work does not seem to help.  Otherwise, there have been no  changes to her past medical history, surgical history, family history, or social history.    Review of Systems  Constitutional: Negative.  Negative for fever, malaise/fatigue and weight loss.  HENT:  Positive for congestion. Negative for ear discharge and ear pain.        Positive for postnasal drip.  Eyes:  Negative for pain, discharge and redness.  Respiratory:  Positive for cough and sputum production. Negative for shortness of breath and wheezing.   Cardiovascular: Negative.  Negative for chest pain and palpitations.  Gastrointestinal:  Negative for abdominal pain, constipation, diarrhea, heartburn, nausea and vomiting.  Skin: Negative.  Negative for itching and rash.  Neurological:  Negative for dizziness and headaches.  Endo/Heme/Allergies:  Negative for environmental allergies. Does not bruise/bleed easily.       Objective:   Blood pressure 110/76, pulse 84, temperature 98.3 F (36.8 C), resp. rate 16, SpO2 99 %. There is no height or weight on file to calculate BMI.    Physical Exam Vitals reviewed.  Constitutional:      Appearance: She is well-developed.  HENT:     Head: Normocephalic and atraumatic.     Right Ear: Tympanic membrane, ear canal and external ear normal. No drainage, swelling or tenderness. Tympanic membrane is not injected, scarred, erythematous, retracted or bulging.     Left Ear: Tympanic membrane, ear canal and external ear normal. No drainage, swelling or tenderness. Tympanic membrane is not injected, scarred, erythematous, retracted or bulging.     Nose: Mucosal edema and rhinorrhea present. No nasal deformity or septal deviation.     Right Turbinates: Enlarged, swollen and pale.     Left Turbinates: Enlarged, swollen and pale.     Right Sinus: No maxillary sinus tenderness or frontal sinus tenderness.     Left Sinus: No maxillary sinus tenderness or frontal sinus tenderness.     Comments: No nasal polyps. Clear rhinorrhea.  Turbinates may be a  little more erythematous today.    Mouth/Throat:     Mouth: Mucous membranes are not pale and not dry.     Pharynx: Uvula midline.     Comments: Cobblestoning present in the posterior oropharynx. Tonsils normally sized bilaterally. Eyes:     General: Lids are normal. Allergic shiner present.        Right eye: No discharge.        Left eye: No discharge.     Conjunctiva/sclera: Conjunctivae normal.     Right eye: Right conjunctiva is not injected. No chemosis.    Left eye: Left conjunctiva is not injected. No chemosis.    Pupils: Pupils are equal, round, and reactive to light.  Cardiovascular:     Rate and Rhythm: Normal rate and regular rhythm.     Heart  sounds: Normal heart sounds.  Pulmonary:     Effort: Pulmonary effort is normal. No tachypnea, accessory muscle usage or respiratory distress.     Breath sounds: Normal breath sounds. No wheezing, rhonchi or rales.     Comments: Moving air well in all lung fields. No increased work of breathing noted.  Chest:     Chest wall: No tenderness.  Abdominal:     Tenderness: There is no abdominal tenderness. There is no guarding or rebound.  Lymphadenopathy:     Head:     Right side of head: No submandibular, tonsillar or occipital adenopathy.     Left side of head: No submandibular, tonsillar or occipital adenopathy.     Cervical: No cervical adenopathy.  Skin:    General: Skin is warm.     Capillary Refill: Capillary refill takes less than 2 seconds.     Coloration: Skin is not pale.     Findings: No abrasion, erythema, petechiae or rash. Rash is not papular, urticarial or vesicular.     Comments: No eczematous or urticarial lesions noted.  Neurological:     Mental Status: She is alert.  Psychiatric:        Behavior: Behavior is cooperative.      Diagnostic studies: none       Malachi Bonds, MD  [facility]

## 2022-02-08 NOTE — Patient Instructions (Addendum)
1. Seasonal and perennial allergic rhinitis (grasses, ragweed, weeds, trees, dust mites, cat, dog, cockroach, and tobacco) - We are going to send in montelukast to your regular pharmacy.  - Continue taking: Atrovent (ipratropium) 0.03% one spray per nostril 2-3 times daily as needed (CAN BE OVER DRYING) - We are going to order a sinus CT for further evaluation of this retention cyst. - We will send you to see Dr. Suszanne Conners for a second opinion.  - Strongly consider allergy shots for long-term control. - We are offering rush immunotherapy or you can finish 3 vials with 1 visit, although it takes nearly all day. - However rash immunotherapy would save 3 to 4 months of weekly visits to get you closer to your maintenance dose, which is most effective. - Let us know about the FMLA paperwork.  2. Return in about 3 months (around 05/11/2022).    Please inform us of any Emergency Department visits, hospitalizations, or changes in symptoms. Call us before going to the ED for breathing or allergy symptoms since we might be able to fit you in for a sick visit. Feel free to contact us anytime with any questions, problems, or concerns.  It was a pleasure to see you again today!  Websites that have reliable patient information: 1. American Academy of Asthma, Allergy, and Immunology: www.aaaai.org 2. Food Allergy Research and Education (FARE): foodallergy.org 3. Mothers of Asthmatics: http://www.asthmacommunitynetwork.org 4. American College of Allergy, Asthma, and Immunology: www.acaai.org   COVID-19 Vaccine Information can be found at: [email or web address] For questions related to vaccine distribution or appointments, please email [email or web address] or call [phone].   We realize that you might be concerned about having an allergic reaction to the COVID19 vaccines. To help with that concern, WE ARE OFFERING THE COVID19 VACCINES IN OUR OFFICE!  Ask the front desk for dates!     "Like" [place] on Facebook and Instagram for our latest updates!      A healthy democracy works best when [employer] participate! Make sure you are registered to vote! If you have moved or changed any of your contact information, you will need to get this updated before voting!  In some cases, you MAY be able to register to vote online: [email or web address]

## 2022-02-09 ENCOUNTER — Encounter: Payer: Self-pay | Admitting: Allergy & Immunology

## 2022-02-09 MED ORDER — IPRATROPIUM BROMIDE 0.03 % NA SOLN: 2.0000 | Freq: Three times a day (TID) | NASAL | 5 refills | Status: DC

## 2022-02-16 ENCOUNTER — Telehealth: Payer: Self-pay

## 2022-02-16 NOTE — Telephone Encounter (Signed)
-----   Message from Alfonse Spruce, MD sent at 02/09/2022  4:55 AM EDT ----- ENT referral placed for Nichole Beck.

## 2022-02-16 NOTE — Telephone Encounter (Signed)
Referral placed to Dr.Teoh  MyChart message sent to patient.

## 2022-04-26 ENCOUNTER — Telehealth: Payer: Self-pay | Admitting: Internal Medicine

## 2022-04-26 MED ORDER — LEVOTHYROXINE SODIUM 100 MCG PO TABS: 100.0000 ug | ORAL_TABLET | Freq: Every day | ORAL | 1 refills | Status: DC

## 2022-04-26 NOTE — Telephone Encounter (Signed)
Refill levothyroxine (SYNTHROID) 100 MCG tablet

## 2022-05-10 ENCOUNTER — Ambulatory Visit (INDEPENDENT_AMBULATORY_CARE_PROVIDER_SITE_OTHER): Payer: Commercial Managed Care - PPO | Admitting: Allergy & Immunology

## 2022-05-10 ENCOUNTER — Encounter: Payer: Self-pay | Admitting: Allergy & Immunology

## 2022-05-10 VITALS — BP 130/90 | HR 81 | Temp 98.0°F | Resp 16 | Ht 67.0 in | Wt 153.8 lb

## 2022-05-10 DIAGNOSIS — J3089 Other allergic rhinitis: Secondary | ICD-10-CM

## 2022-05-10 DIAGNOSIS — J341 Cyst and mucocele of nose and nasal sinus: Secondary | ICD-10-CM | POA: Diagnosis not present

## 2022-05-10 DIAGNOSIS — R1319 Other dysphagia: Secondary | ICD-10-CM | POA: Diagnosis not present

## 2022-05-10 DIAGNOSIS — J302 Other seasonal allergic rhinitis: Secondary | ICD-10-CM

## 2022-05-10 MED ORDER — FAMOTIDINE 20 MG PO TABS: 20.0000 mg | ORAL_TABLET | Freq: Two times a day (BID) | ORAL | 2 refills | Status: DC

## 2022-05-10 NOTE — Progress Notes (Signed)
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 Seasonal and perennial allergic rhinitis (grasses, ragweed, weeds, trees, dust mites, cat, dog, cockroach, and tobacco) - Continue taking: Atrovent (ipratropium) 0.03% one spray per nostril 2-3 times daily as needed (CAN BE OVER DRYING) - Stop taking the montelukast.  - We are going to order a sinus CT for further evaluation of this retention cyst. - Call Dr. Benjamine Mola for a second opinion (referral is still active): [phone] - Call [facility] to schedule the sinus CT: [phone] - We are going to add on famotidine 20mg  twice daily to help with any coexisting reflux.  - This will be interesting to see if this helps at all.   2. Return in about 6 months (around 11/09/2022).   Subjective:   Daniel Johndrow is a 51 y.o. female presenting today for follow up of  Chief Complaint  Patient presents with   Seasonal and Perennial Allergic rhinitis    3 mth f/u - Still have a tickle in her throat    Lyndsie Wallman has a history of the following: Patient Active Problem List   Diagnosis Date Noted   Bilateral hand pain 05/31/2021   Multiple thyroid nodules 05/25/2021   Dysphagia 03/25/2021   Hypothyroidism     History obtained from: chart review and patient.  Raeli is a 51 y.o. female presenting for a follow up visit.  She was last seen in July 2023.  At that time, she continued to have quite a lot of sinus symptoms.  We continue with Atrovent 1 spray per nostril 2-3 times daily.  We ordered a repeat sinus CT and sent her to see Dr. Benjamine Mola for a second opinion.  We did talk about starting allergy shots.  We also  sent in montelukast to her regular pharmacy.  Since the last visit, she has not made any changes. She never got her sinus CT performed and she never went to see Dr. Benjamine Mola.   Since the last visit, she has continued to have problems. She apparently played phone tag with Dr. Benjamine Mola about scheduling the CT scan and she was transferred all over the place. She was going through too much and she wants to start again.  She is using her nose sprays but she thinks that this is overdrying at some times. She will stop for a few days and then return. None of the pills have seemed to have done anything. She has been taking the montelukast, but she is not sure that it is helping much at all. She has not been having really bad symptoms or anything like that.  She only ever has symptoms at work. Some days are fine but then others are a problem. She does report some she feels that something is in her throat. Per the patient, Dr. Candis Schatz never found any findings to explain her symptoms. She is unsure whether the Prilosec did anything at all. She does not remember how long that she took that.   Work is going well. She has not brought in her FMLA yet at this point. But she is open to it.   Otherwise, there have been no changes to  her past medical history, surgical history, family history, or social history.    Review of Systems  Constitutional: Negative.  Negative for chills, fever, malaise/fatigue and weight loss.  HENT:  Positive for congestion. Negative for ear discharge, ear pain and sinus pain.   Eyes:  Negative for pain, discharge and redness.  Respiratory:  Negative for cough, sputum production, shortness of breath and wheezing.   Cardiovascular: Negative.  Negative for chest pain and palpitations.  Gastrointestinal:  Negative for abdominal pain, constipation, diarrhea, heartburn, nausea and vomiting.  Skin: Negative.  Negative for itching and rash.  Neurological:  Negative for dizziness and headaches.   Endo/Heme/Allergies:  Positive for environmental allergies. Does not bruise/bleed easily.       Objective:   Blood pressure (!) 130/90, pulse 81, temperature 98 F (36.7 C), resp. rate 16, height 5\' 7"  (1.702 m), weight 153 lb 12.8 oz (69.8 kg), SpO2 100 %. Body mass index is 24.09 kg/m.    Physical Exam Vitals reviewed.  Constitutional:      Appearance: She is well-developed.  HENT:     Head: Normocephalic and atraumatic.     Right Ear: Tympanic membrane, ear canal and external ear normal. No drainage, swelling or tenderness. Tympanic membrane is not injected, scarred, erythematous, retracted or bulging.     Left Ear: Tympanic membrane, ear canal and external ear normal. No drainage, swelling or tenderness. Tympanic membrane is not injected, scarred, erythematous, retracted or bulging.     Nose: Mucosal edema and rhinorrhea present. No nasal deformity or septal deviation.     Right Turbinates: Enlarged, swollen and pale.     Left Turbinates: Enlarged, swollen and pale.     Right Sinus: No maxillary sinus tenderness or frontal sinus tenderness.     Left Sinus: No maxillary sinus tenderness or frontal sinus tenderness.     Comments: No nasal polyps. Clear rhinorrhea.  Turbinates may be a little more erythematous today.    Mouth/Throat:     Mouth: Mucous membranes are not pale and not dry.     Pharynx: Uvula midline.     Comments: Cobblestoning present in the posterior oropharynx. Tonsils normally sized bilaterally. Eyes:     General: Lids are normal. Allergic shiner present.        Right eye: No discharge.        Left eye: No discharge.     Conjunctiva/sclera: Conjunctivae normal.     Right eye: Right conjunctiva is not injected. No chemosis.    Left eye: Left conjunctiva is not injected. No chemosis.    Pupils: Pupils are equal, round, and reactive to light.  Cardiovascular:     Rate and Rhythm: Normal rate and regular rhythm.     Heart sounds: Normal heart sounds.   Pulmonary:     Effort: Pulmonary effort is normal. No tachypnea, accessory muscle usage or respiratory distress.     Breath sounds: Normal breath sounds. No wheezing, rhonchi or rales.     Comments: Moving air well in all lung fields. No increased work of breathing noted.  Chest:     Chest wall: No tenderness.  Abdominal:     Tenderness: There is no abdominal tenderness. There is no guarding or rebound.  Lymphadenopathy:     Head:     Right side of head: No submandibular, tonsillar or occipital adenopathy.     Left side of head: No submandibular, tonsillar or occipital adenopathy.     Cervical: No cervical adenopathy.  Skin:  General: Skin is warm.     Capillary Refill: Capillary refill takes less than 2 seconds.     Coloration: Skin is not pale.     Findings: No abrasion, erythema, petechiae or rash. Rash is not papular, urticarial or vesicular.     Comments: No eczematous or urticarial lesions noted.  Neurological:     Mental Status: She is alert.  Psychiatric:        Behavior: Behavior is cooperative.      Diagnostic studies: none     Salvatore Marvel, MD  [facility]

## 2022-05-10 NOTE — Patient Instructions (Addendum)
1. Seasonal and perennial allergic rhinitis (grasses, ragweed, weeds, trees, dust mites, cat, dog, cockroach, and tobacco) - Continue taking: Atrovent (ipratropium) 0.03% one spray per nostril 2-3 times daily as needed (CAN BE OVER DRYING) - Stop taking the montelukast.  - We are going to order a sinus CT for further evaluation of this retention cyst. - Call Dr. Benjamine Mola for a second opinion (referral is still active): [phone] - Call [facility] to schedule the sinus CT: [phone] - We are going to add on famotidine 20mg  twice daily to help with any coexisting reflux.  - This will be interesting to see if this helps at all.   2. Return in about 6 months (around 11/09/2022).    Please inform us of any Emergency Department visits, hospitalizations, or changes in symptoms. Call us before going to the ED for breathing or allergy symptoms since we might be able to fit you in for a sick visit. Feel free to contact us anytime with any questions, problems, or concerns.  It was a pleasure to see you again today!  Websites that have reliable patient information: 1. American Academy of Asthma, Allergy, and Immunology: www.aaaai.org 2. Food Allergy Research and Education (FARE): foodallergy.org 3. Mothers of Asthmatics: http://www.asthmacommunitynetwork.org 4. American College of Allergy, Asthma, and Immunology: www.acaai.org   COVID-19 Vaccine Information can be found at: [email or web address].[email or web address].uk For questions related to vaccine distribution or appointments, please email [email or web address] or call [phone].   We realize that you might be concerned about having an allergic reaction to the COVID19 vaccines. To help with that concern, WE ARE OFFERING THE COVID19 VACCINES IN OUR OFFICE! Ask the front desk for dates!     "Like" [place] on Facebook and Instagram for our latest updates!      A healthy democracy works best when [employer] participate! Make sure you are registered to vote! If you have moved or changed any of your contact information, you will need to get this updated before voting!  In some cases, you MAY be able to register to vote online: [email or web address]

## 2022-05-11 ENCOUNTER — Telehealth: Payer: Self-pay | Admitting: Allergy & Immunology

## 2022-05-11 MED ORDER — OMEPRAZOLE 40 MG PO CPDR: 40.0000 mg | DELAYED_RELEASE_CAPSULE | Freq: Every day | ORAL | 5 refills | Status: DC

## 2022-05-11 NOTE — Telephone Encounter (Signed)
Nichole Beck called in and states can something else be prescribed in place of the Pepcid.  She states it was very costly.  She uses [facility] on [address]

## 2022-05-11 NOTE — Telephone Encounter (Signed)
I sent in omeprazole 40 mg once daily.  Salvatore Marvel, MD [facility] [place]

## 2022-05-11 NOTE — Telephone Encounter (Signed)
Please advise to change in medication.  

## 2022-05-11 NOTE — Telephone Encounter (Signed)
LVM informing the patient that her medication has been sent into the pharmacy.

## 2022-06-20 ENCOUNTER — Telehealth: Payer: Self-pay

## 2022-06-20 NOTE — Telephone Encounter (Signed)
---  Caller states she has a rash between toes there is a rash in between toes on left foot and its itching and she scratch it and its spreading now and it hurts and spreading on back of both feet. States she has put Cortisone on it and does not help.  06/17/2022 11:17:09 AM See HCP within 4 Hours (or PCP triage) Andrey Campanile, RN, Angela  Referrals GO TO FACILITY UNDECIDED  06/20/22 1007 - LVM instructions for pt to call to f/u  Of Note: last CPE in 2022. Please schedule if pt calls back, THEN transfer to [facility].

## 2022-08-02 ENCOUNTER — Emergency Department (HOSPITAL_BASED_OUTPATIENT_CLINIC_OR_DEPARTMENT_OTHER)
Admission: EM | Admit: 2022-08-02 | Discharge: 2022-08-02 | Disposition: A | Discharge status: Home / Self Care | Payer: Commercial Managed Care - PPO | Attending: Emergency Medicine | Admitting: Emergency Medicine

## 2022-08-02 ENCOUNTER — Other Ambulatory Visit: Payer: Self-pay

## 2022-08-02 DIAGNOSIS — M5432 Sciatica, left side: Secondary | ICD-10-CM | POA: Diagnosis not present

## 2022-08-02 DIAGNOSIS — M79605 Pain in left leg: Secondary | ICD-10-CM | POA: Diagnosis present

## 2022-08-02 DIAGNOSIS — Z79899 Other long term (current) drug therapy: Secondary | ICD-10-CM | POA: Diagnosis not present

## 2022-08-02 DIAGNOSIS — M533 Sacrococcygeal disorders, not elsewhere classified: Secondary | ICD-10-CM | POA: Diagnosis not present

## 2022-08-02 MED ORDER — HYDROCODONE-ACETAMINOPHEN 5-325 MG PO TABS: 1.0000 | ORAL_TABLET | Freq: Once | ORAL | Status: DC

## 2022-08-02 MED ORDER — CYCLOBENZAPRINE HCL 5 MG PO TABS: 5.0000 mg | ORAL_TABLET | Freq: Once | ORAL | Status: DC

## 2022-08-02 MED ORDER — KETOROLAC TROMETHAMINE 10 MG PO TABS: 10.0000 mg | ORAL_TABLET | Freq: Four times a day (QID) | ORAL | 0 refills | Status: DC | PRN

## 2022-08-02 MED ORDER — CYCLOBENZAPRINE HCL 5 MG PO TABS: 5.0000 mg | ORAL_TABLET | Freq: Two times a day (BID) | ORAL | 0 refills | Status: DC | PRN

## 2022-08-02 MED ORDER — LIDOCAINE 5 % EX PTCH: 1.0000 | MEDICATED_PATCH | CUTANEOUS | Status: DC

## 2022-08-02 MED ORDER — IBUPROFEN 800 MG PO TABS: 800.0000 mg | ORAL_TABLET | Freq: Once | ORAL | Status: DC

## 2022-08-02 MED ORDER — LIDOCAINE 5 % EX PTCH: 1.0000 | MEDICATED_PATCH | CUTANEOUS | 0 refills | Status: DC

## 2022-08-02 NOTE — ED Triage Notes (Signed)
Pt arrives to ED with c/o left leg pain that started this morning. She denies swelling.

## 2022-08-02 NOTE — ED Provider Notes (Signed)
[facility] EMERGENCY DEPT Provider Note   CSN: [id number] Arrival date & time: 08/02/22  1001     History  Chief Complaint  Patient presents with   Leg Pain    Nichole Beck is a 52 y.o. female, no pertinent past medical history, who presents to the ED secondary to left leg pain for the last day.  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  She notes that last night she started having some buttocks pain that radiated down her leg, that has been very painful, and made it difficult to sleep.  She denies any swelling, trauma.  No oral contraceptive use.  No recent surgeries or cancer.  She states the pain is worse when she walks.  Has never had this happen before.  No rash.     Home Medications Prior to Admission medications   Medication Sig Start Date End Date Taking? Authorizing Provider  cyclobenzaprine (FLEXERIL) 5 MG tablet Take 1 tablet (5 mg total) by mouth 2 (two) times daily as needed for muscle spasms. 08/02/22  Yes Hester Joslin L, PA  ketorolac (TORADOL) 10 MG tablet Take 1 tablet (10 mg total) by mouth every 6 (six) hours as needed. 08/02/22  Yes Taimi Towe L, PA  lidocaine (LIDODERM) 5 % Place 1 patch onto the skin daily. Remove & Discard patch within 12 hours or as directed by MD 08/02/22  Yes Korbyn Vanes L, PA  Azelastine HCl 137 MCG/SPRAY SOLN Place 1 spray into both nostrils as directed. 02/08/22   [provider]  famotidine (PEPCID) 20 MG tablet Take 1 tablet (20 mg total) by mouth 2 (two) times daily. 05/10/22 06/09/22  Valentina Shaggy, MD  ipratropium (ATROVENT) 0.03 % nasal spray Place 2 sprays into both nostrils 3 (three) times daily. 02/09/22   Valentina Shaggy, MD  levothyroxine (SYNTHROID) 100 MCG tablet Take 1 tablet (100 mcg total) by mouth daily. 04/26/22   Isaac Bliss, Rayford Halsted, MD  methocarbamol (ROBAXIN) 500 MG tablet Take 1 tablet (500 mg total) by mouth 2 (two) times daily. 11/16/21   Truddie Hidden, MD   omeprazole (PRILOSEC) 40 MG capsule Take 1 capsule (40 mg total) by mouth daily. 05/11/22 06/10/22  Valentina Shaggy, MD      Allergies    Patient has no known allergies.    Review of Systems   Review of Systems  Respiratory:  Negative for shortness of breath.   Cardiovascular:  Negative for leg swelling.  Musculoskeletal:        +L leg pain    Physical Exam Updated Vital Signs BP 122/88 (BP Location: Left Arm)   Pulse 88   Temp 98.4 F (36.9 C) (Temporal)   Resp 18   Ht 5\' 7"  (1.702 m)   Wt 68 kg   LMP  (LMP Unknown)   SpO2 99%   BMI 23.49 kg/m  Physical Exam Vitals and nursing note reviewed.  Constitutional:      General: She is not in acute distress.    Appearance: She is well-developed. She is not toxic-appearing.  HENT:     Head: Normocephalic and atraumatic.  Eyes:     Conjunctiva/sclera: Conjunctivae normal.  Cardiovascular:     Rate and Rhythm: Normal rate and regular rhythm.     Heart sounds: No murmur heard. Pulmonary:     Effort: Pulmonary effort is normal. No respiratory distress.     Breath sounds: Normal breath sounds.  Abdominal:  General: There is no distension.     Palpations: Abdomen is soft.     Tenderness: There is no abdominal tenderness.  Musculoskeletal:        General: No swelling.     Cervical back: Normal range of motion and neck supple. No spinous process tenderness or muscular tenderness.     Comments: No obvious deformity, appreciable swelling, erythema, ecchymosis, significant open wounds, or increased warmth.  Back: No point/focal vertebral tenderness, no palpable step off or crepitus. TTP along L SI joint, radiating down L buttocks. +SLR Lower extremities: ranging @ all major joints. No focal bony tenderness. +SLR   Skin:    General: Skin is warm and dry.     Capillary Refill: Capillary refill takes less than 2 seconds.     Findings: No rash.  Neurological:     Mental Status: She is alert.     Comments: Sensation  grossly intact to bilateral lower extremities. 5/5 symmetric strength with plantar/dorsiflexion bilaterally. Gait is intact without obvious foot drop.   Psychiatric:        Mood and Affect: Mood normal.     ED Results / Procedures / Treatments   Labs (all labs ordered are listed, but only abnormal results are displayed) Labs Reviewed - No data to display  EKG None  Radiology No results found.  Procedures Procedures    Medications Ordered in ED Medications  HYDROcodone-acetaminophen (NORCO/VICODIN) 5-325 MG per tablet 1 tablet (has no administration in time range)  ibuprofen (ADVIL) tablet 800 mg (has no administration in time range)  cyclobenzaprine (FLEXERIL) tablet 5 mg (has no administration in time range)  lidocaine (LIDODERM) 5 % 1 patch (has no administration in time range)    ED Course/ Medical Decision Making/ A&P                           Medical Decision Making Patient is a 52 year old female, here for left leg pain that is been on for the past day, worse when walking, has positive straight leg raise, no known trauma.  No swelling of the legs.  Discussed with patient, given her presentation, concern for SI joint dysfunction, that is causing sciatica.  She does have tenderness to palpation of her SI joint, on the left side, and feels like her pelvis is tilted.  We discussed following up with chiropractor and PCP, and given Toradol, Flexeril, and lidocaine patches for home.  I gave her some time off of work, and advised her not to use Flexeril with her driving as this may disrupt it, and cause increased fatigue and increased risk of accidents.  She voiced understanding.  Return precautions emphasized.  Risk Prescription drug management.   Final Clinical Impression(s) / ED Diagnoses Final diagnoses:  Sciatica of left side  SI (sacroiliac) joint dysfunction    Rx / DC Orders ED Discharge Orders          Ordered    ketorolac (TORADOL) 10 MG tablet  Every 6 hours  PRN        08/02/22 1211    cyclobenzaprine (FLEXERIL) 5 MG tablet  2 times daily PRN        08/02/22 1211    lidocaine (LIDODERM) 5 %  Every 24 hours        08/02/22 1211              Brennen Gardiner Carlean Jews, [place] 08/02/22 1220    Blanchie Dessert, MD 08/07/22 1649

## 2022-08-02 NOTE — Discharge Instructions (Addendum)
Please follow-up with a chiropractor, for further evaluation.  You should take your Toradol as needed for pain control, and only use muscle relaxers when not driving.  Do not use muscle relaxers when driving as they may cause some sleepiness.  Do not use ibuprofen, naproxen with Toradol as they are both anti-inflammatories.  If you start having loss of bowel, bladder, fever please return to the ER.

## 2022-09-12 ENCOUNTER — Ambulatory Visit (INDEPENDENT_AMBULATORY_CARE_PROVIDER_SITE_OTHER): Payer: Commercial Managed Care - PPO | Admitting: Family Medicine

## 2022-09-12 ENCOUNTER — Encounter: Payer: Self-pay | Admitting: Family Medicine

## 2022-09-12 VITALS — BP 132/90 | HR 85 | Temp 97.9°F | Ht 67.0 in | Wt 154.1 lb

## 2022-09-12 DIAGNOSIS — L301 Dyshidrosis [pompholyx]: Secondary | ICD-10-CM

## 2022-09-12 MED ORDER — BETAMETHASONE VALERATE 0.1 % EX OINT: 1.0000 | TOPICAL_OINTMENT | Freq: Two times a day (BID) | CUTANEOUS | 1 refills | Status: DC | PRN

## 2022-09-12 NOTE — Progress Notes (Signed)
   Established Patient Office Visit  Subjective   Patient ID: Nichole Beck, female    DOB: 1971/06/30  Age: 52 y.o. MRN: [id number]  Chief Complaint  Patient presents with   Rash    Patient complains of rash, x2 weeks, Tired Cortisone cream with little relief     HPI   Seen with pruritic rash on both hands and also some mild involvement of both feet.  Came up several weeks ago.  No prior history of eczema.  Rash is very pruritic.  Rash on her hands started as small clear blisters which then ruptured and became very pruritic.  No pustules.  She tried over-the-counter hydrocortisone cream and lotions without improvement.  She uses [employer] for cleansing  Past Medical History:  Diagnosis Date   Abnormal Pap smear of cervix    many yrs ago   Anemia    Hypothyroidism    Nicotine dependence    STD (sexually transmitted disease)    gonorrhea treated   Urticaria    Past Surgical History:  Procedure Laterality Date   BREAST BIOPSY     COLPOSCOPY     TUBAL LIGATION      reports that she has been smoking cigarettes. She has a 16.50 pack-year smoking history. She has never been exposed to tobacco smoke. She has never used smokeless tobacco. She reports current alcohol use. She reports that she does not use drugs. family history includes Asthma in her daughter; Cancer in her maternal grandmother and paternal grandmother; Diabetes in her maternal grandmother and paternal grandmother. No Known Allergies  Review of Systems  Constitutional:  Negative for chills and fever.  Skin:  Positive for rash.      Objective:     BP (!) 132/90   Pulse 85   Temp 97.9 F (36.6 C) (Oral)   Ht 5\' 7"  (1.702 m)   Wt 154 lb 1.6 oz (69.9 kg)   LMP  (LMP Unknown)   SpO2 99%   BMI 24.14 kg/m    Physical Exam Vitals reviewed.  Constitutional:      Appearance: Normal appearance.  Cardiovascular:     Rate and Rhythm: Normal rate and regular rhythm.  Skin:    Comments: Scattered areas of rash  including very small patch on her right palm.  She has some involvement of rash left middle finger near the base with acute few clear vesicles and few scaly areas.  Similar rash right and left thumb. no Pustules.  Neurological:     Mental Status: She is alert.      No results found for any visits on 09/12/22.    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  Recommend betamethasone 0.1% ointment use twice daily as needed.  We also cautioned her against more than 2 weeks of continuous use and reviewed potential side effects of high potency topical steroids.  Be in touch if rash not improving over the next couple weeks.  We also explained that eczema  can be treated but not cured   Carolann Littler, MD

## 2022-10-04 IMAGING — MG DIGITAL SCREENING BILAT W/ CAD
5 series · 5 of 5 positions shown · non-contrast
Comparison: Previous exam(s).

CLINICAL DATA: Screening.

EXAM:
DIGITAL SCREENING BILATERAL MAMMOGRAM WITH CAD
TECHNIQUE: Bilateral screening digital craniocaudal and mediolateral oblique
mammograms were obtained. The images were evaluated with
computer-aided detection.

[L CC]
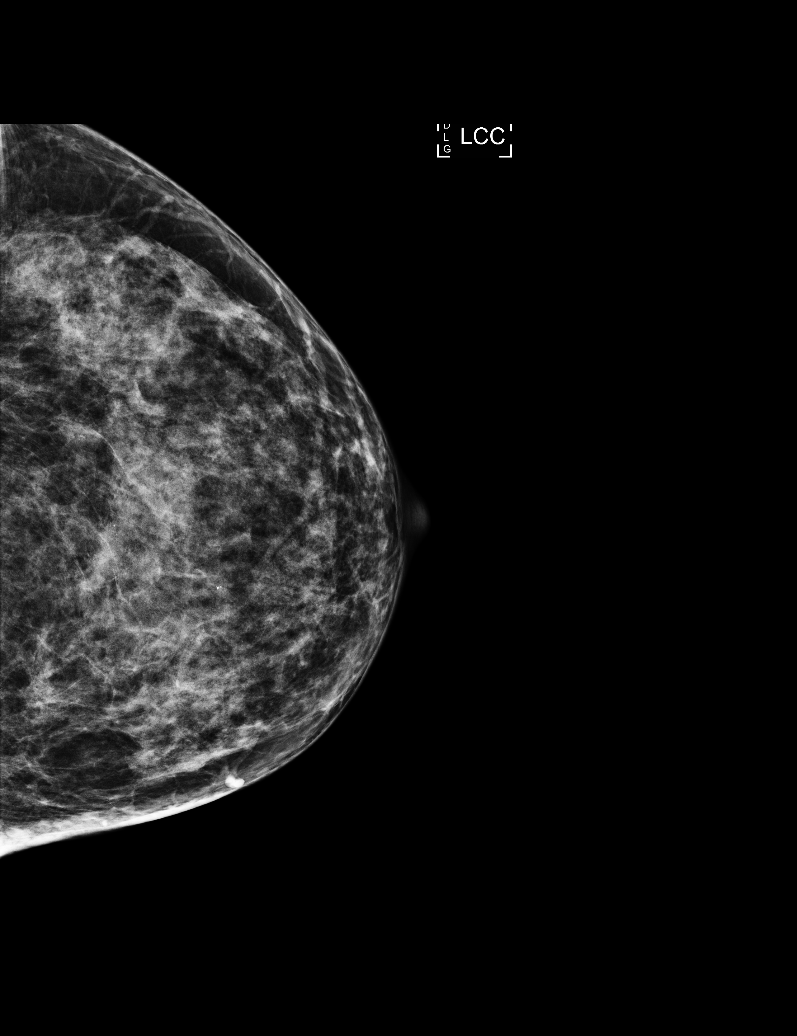

[R MLO]
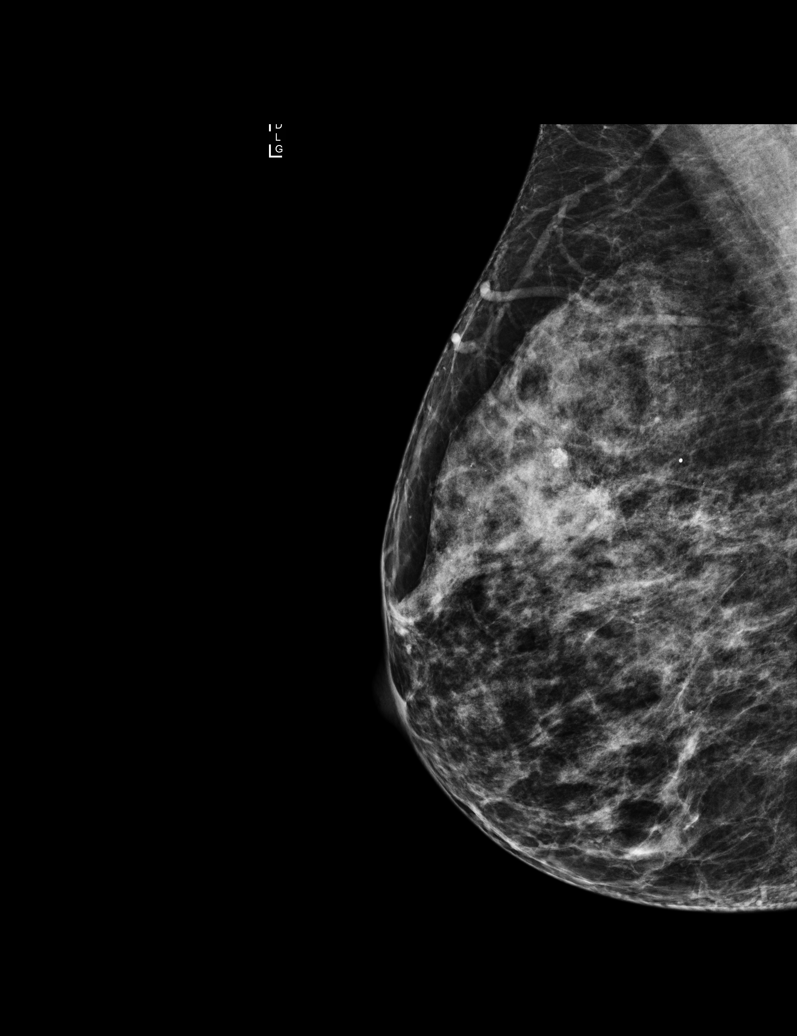

[R CC (1 of 2)]
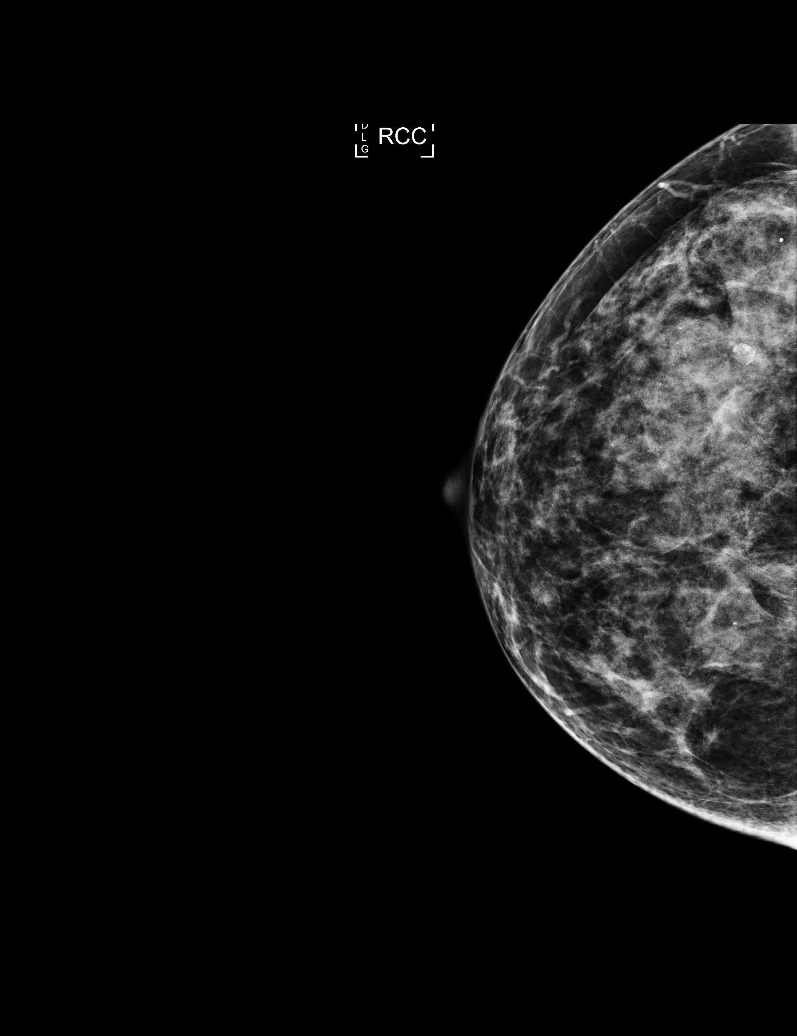

[L MLO]
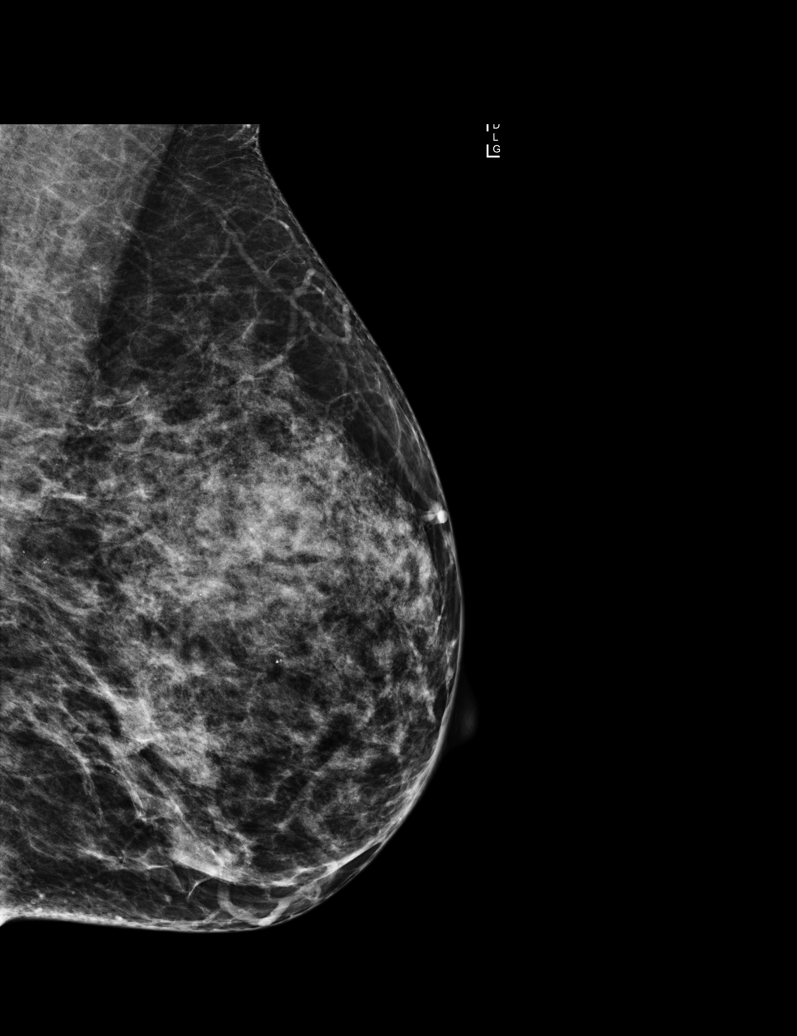

[R CC (2 of 2)]
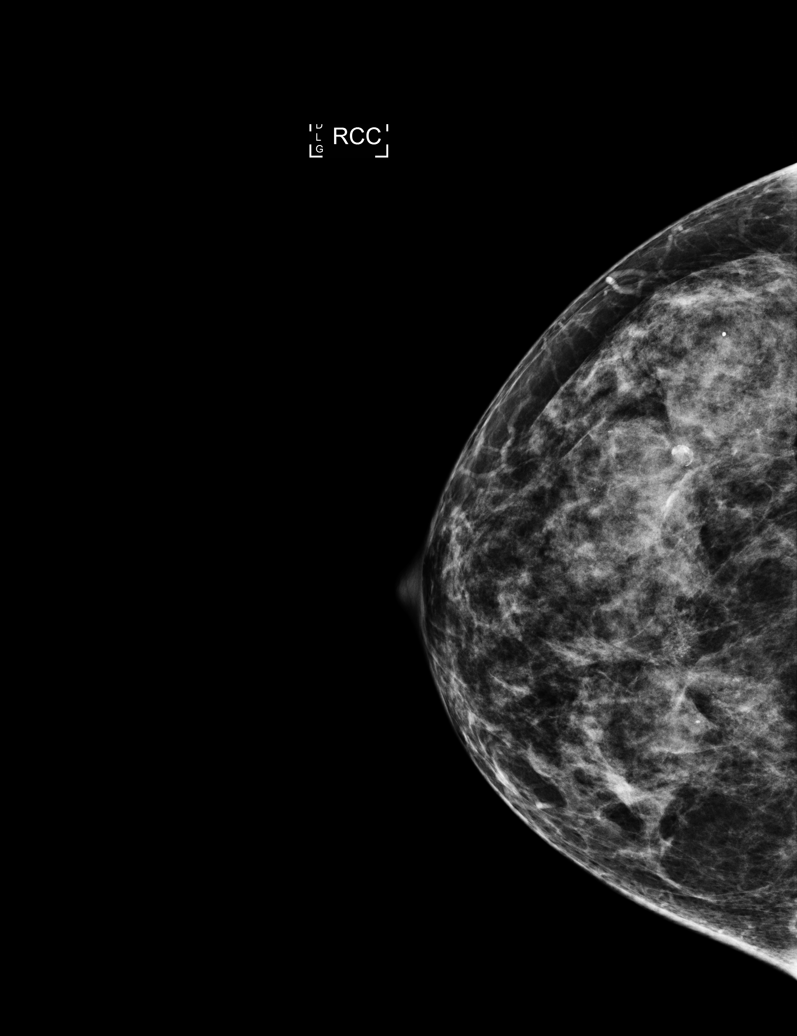

[5 of 5 positions shown; findings below may reference images not displayed]

ACR Breast Density Category c: The breast tissue is heterogeneously
dense, which may obscure small masses.
FINDINGS: There are no findings suspicious for malignancy.
IMPRESSION: No mammographic evidence of malignancy. A result letter of this
screening mammogram will be mailed directly to the patient.

RECOMMENDATION:
Screening mammogram in one year. (Code:TJ-B-ZPA)

BI-RADS CATEGORY  1: Negative.

## 2022-11-03 ENCOUNTER — Telehealth: Payer: Self-pay | Admitting: Internal Medicine

## 2022-11-03 MED ORDER — LEVOTHYROXINE SODIUM 100 MCG PO TABS: 100.0000 ug | ORAL_TABLET | Freq: Every day | ORAL | 0 refills | Status: DC

## 2022-11-03 NOTE — Telephone Encounter (Signed)
Pt is calling and she is unable to come in for an appt due to she does not want to lose pay. Pt is available on sat and sun and the office is close. Pt is requesting a refill levothyroxine (SYNTHROID) 100 MCG tablet  Pt last seen on 08-19-2021  [place], [place] [address] Phone: [phone]  Fax: [phone]

## 2022-11-03 NOTE — Telephone Encounter (Signed)
Refill sent with note to patient

## 2022-11-10 ENCOUNTER — Ambulatory Visit: Payer: Commercial Managed Care - PPO | Admitting: Allergy & Immunology

## 2022-11-10 ENCOUNTER — Encounter: Payer: Self-pay | Admitting: Allergy & Immunology

## 2022-11-10 ENCOUNTER — Other Ambulatory Visit: Payer: Self-pay

## 2022-11-10 VITALS — BP 130/80 | HR 91 | Temp 98.2°F | Resp 16 | Wt 158.2 lb

## 2022-11-10 DIAGNOSIS — J3089 Other allergic rhinitis: Secondary | ICD-10-CM

## 2022-11-10 DIAGNOSIS — R1319 Other dysphagia: Secondary | ICD-10-CM | POA: Diagnosis not present

## 2022-11-10 DIAGNOSIS — J302 Other seasonal allergic rhinitis: Secondary | ICD-10-CM

## 2022-11-10 DIAGNOSIS — R21 Rash and other nonspecific skin eruption: Secondary | ICD-10-CM

## 2022-11-10 DIAGNOSIS — J341 Cyst and mucocele of nose and nasal sinus: Secondary | ICD-10-CM

## 2022-11-10 MED ORDER — OPZELURA 1.5 % EX CREA: 1.0000 | TOPICAL_CREAM | Freq: Two times a day (BID) | CUTANEOUS | 5 refills | Status: DC | PRN

## 2022-11-10 MED ORDER — AZELASTINE HCL 137 MCG/SPRAY NA SOLN: 1.0000 | NASAL | 5 refills | Status: DC

## 2022-11-10 MED ORDER — IPRATROPIUM BROMIDE 0.03 % NA SOLN: 2.0000 | Freq: Three times a day (TID) | NASAL | 5 refills | Status: DC

## 2022-11-10 NOTE — Progress Notes (Signed)
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 Seasonal and perennial allergic rhinitis (grasses, ragweed, weeds, trees, dust mites, cat, dog, cockroach, and tobacco) - Continue taking: Atrovent (ipratropium) 0.03% one spray per nostril 2-3 times daily as needed (CAN BE OVER DRYING) - Call Dr. Suszanne Conners for a second opinion (referral is still active): [phone]  2. Rash - Start Opzelura twice daily on the rash to see if this helps at all. - This is not a steroid and does not sauce the side effects of steroids. - It can be used from head to toe.   3. Return in about 6 months (around 05/12/2023).    Subjective:   Nichole Beck is a 52 y.o. female presenting today for follow up of  Chief Complaint  Patient presents with   Follow-up    Nichole Beck has a history of the following: Patient Active Problem List   Diagnosis Date Noted   Bilateral hand pain 05/31/2021   Multiple thyroid nodules 05/25/2021   Dysphagia 03/25/2021   Hypothyroidism     History obtained from: chart review and patient.  [name] is a 52 y.o. female presenting for a follow up visit.  We last saw her in October 2023.  At that time, we continue with Atrovent.  We stopped montelukast.  We ordered a sinus CT for further evaluation of a retention cyst.  We added on famotidine 20 mg twice daily to combat any reflux.  Since the last visit, she is still having the same issues. She reports that she is still having the sensation but it is better overall. She never did see Dr. Suszanne Conners and never made a phone call despite [place] providing her  with the number.  She is willing to give them a call again as well for second opinion.  She did not like her first otolaryngologist.    It does not seem like we ever get the sinus CT approved either.  There is no telephone trail to show that they will deny it either.  She remains on the ipratropium.  She uses this fairly rarely.  She feels that her nose is overly dry when she uses this.  She remains on the nasal saline rinses as needed.  She never did start the Pepcid because she felt it was too expensive. She also remains on the ipratropium.   She has not been on antibiotics or prednisone since the last time we saw her.  She has issues when she is working.  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  She has not missed any work for this.  We did fill out FMLA papers for her at some point.  She has failed Flonase.  She has failed Xhance.  She also failed Astelin and Nasacort.  She was on Allegra without improvement.  She was on  montelukast without improvement.  She remains uninterested in starting allergen immunotherapy.  Skin Symptom History: She does report that she had a skin problem on her bilateral hands. She has bene using some ointment by her PCP. It has not cleared up.  She has a rash on her bilateral palms as well as in her armpits and on her feet.  She has not changed her deodorant and has no new exposures at all.  She has not seen the dermatologist.  Otherwise, there have been no changes to her past medical history, surgical history, family history, or social history.    Review of Systems  Constitutional: Negative.  Negative for chills, fever, malaise/fatigue and weight loss.  HENT:  Positive for congestion. Negative for ear  discharge, ear pain and sinus pain.   Eyes:  Negative for pain, discharge and redness.  Respiratory:  Negative for cough, sputum production, shortness of breath and wheezing.   Cardiovascular: Negative.  Negative for chest pain and palpitations.  Gastrointestinal:  Negative for abdominal pain, constipation, diarrhea, heartburn, nausea and vomiting.  Skin:  Positive for itching and rash.  Neurological:  Negative for dizziness and headaches.  Endo/Heme/Allergies:  Positive for environmental allergies. Does not bruise/bleed easily.       Objective:   Blood pressure 130/80, pulse 91, temperature 98.2 F (36.8 C), temperature source Temporal, resp. rate 16, weight 158 lb 3.2 oz (71.8 kg), SpO2 98 %. Body mass index is 24.78 kg/m.    Physical Exam Vitals reviewed.  Constitutional:      Appearance: She is well-developed.  HENT:     Head: Normocephalic and atraumatic.     Right Ear: Tympanic membrane, ear canal and external ear normal. No drainage, swelling or tenderness. Tympanic membrane is not injected, scarred, erythematous, retracted or bulging.     Left Ear: Tympanic membrane, ear canal and external ear normal. No drainage, swelling or tenderness. Tympanic membrane is not injected, scarred, erythematous, retracted or bulging.     Nose: Mucosal edema and rhinorrhea present. No nasal deformity or septal deviation.     Right Turbinates: Enlarged, swollen and pale.     Left Turbinates: Enlarged, swollen and pale.     Right Sinus: No maxillary sinus tenderness or frontal sinus tenderness.     Left Sinus: No maxillary sinus tenderness or frontal sinus tenderness.     Comments: No nasal polyps. Clear rhinorrhea.  Turbinates enlarged and erythematous.    Mouth/Throat:     Lips: Pink.     Mouth: Mucous membranes are moist. Mucous membranes are not pale and not dry.     Pharynx: Uvula midline.     Comments: Cobblestoning present in the posterior oropharynx. Tonsils normally sized  bilaterally. Eyes:     General: Lids are normal. Allergic shiner present.        Right eye: No discharge.        Left eye: No discharge.     Conjunctiva/sclera: Conjunctivae normal.     Right eye: Right conjunctiva is not injected. No chemosis.    Left eye: Left conjunctiva is not injected. No chemosis.    Pupils: Pupils are equal, round, and reactive to light.  Cardiovascular:     Rate and Rhythm: Normal rate and regular rhythm.     Heart sounds: Normal heart sounds.  Pulmonary:     Effort: Pulmonary effort is normal. No tachypnea, accessory muscle usage or respiratory distress.     Breath sounds: Normal breath sounds. No wheezing, rhonchi or rales.  Comments: Moving air well in all lung fields. No increased work of breathing noted.  Chest:     Chest wall: No tenderness.  Abdominal:     Tenderness: There is no abdominal tenderness. There is no guarding or rebound.  Lymphadenopathy:     Head:     Right side of head: No submandibular, tonsillar or occipital adenopathy.     Left side of head: No submandibular, tonsillar or occipital adenopathy.     Cervical: No cervical adenopathy.  Skin:    General: Skin is warm.     Capillary Refill: Capillary refill takes less than 2 seconds.     Coloration: Skin is not pale.     Findings: No abrasion, erythema, petechiae or rash. Rash is not papular, urticarial or vesicular.     Comments: No eczematous or urticarial lesions noted.  Neurological:     Mental Status: She is alert.  Psychiatric:        Behavior: Behavior is cooperative.      Diagnostic studies: none      Malachi BondsJoel Hollis Tuller, MD  [facility]

## 2022-11-10 NOTE — Patient Instructions (Addendum)
1. Seasonal and perennial allergic rhinitis (grasses, ragweed, weeds, trees, dust mites, cat, dog, cockroach, and tobacco) - Continue taking: Atrovent (ipratropium) 0.03% one spray per nostril 2-3 times daily as needed (CAN BE OVER DRYING) - Call Dr. Suszanne Conners for a second opinion (referral is still active): [phone]  2. Rash - Start Opzelura twice daily on the rash to see if this helps at all. - This is not a steroid and does not sauce the side effects of steroids. - It can be used from head to toe.   3. Return in about 6 months (around 05/12/2023).    Please inform us of any Emergency Department visits, hospitalizations, or changes in symptoms. Call us before going to the ED for breathing or allergy symptoms since we might be able to fit you in for a sick visit. Feel free to contact us anytime with any questions, problems, or concerns.  It was a pleasure to see you again today!  Websites that have reliable patient information: 1. American Academy of Asthma, Allergy, and Immunology: www.aaaai.org 2. Food Allergy Research and Education (FARE): foodallergy.org 3. Mothers of Asthmatics: http://www.asthmacommunitynetwork.org 4. American College of Allergy, Asthma, and Immunology: www.acaai.org   COVID-19 Vaccine Information can be found at: [email or web address].[email or web address] For questions related to vaccine distribution or appointments, please email [email or web address] or call [phone].   We realize that you might be concerned about having an allergic reaction to the COVID19 vaccines. To help with that concern, WE ARE OFFERING THE COVID19 VACCINES IN OUR OFFICE! Ask the front desk for dates!     "Like" [place] on Facebook and Instagram for our latest updates!      A healthy democracy works best when [employer] participate! Make sure you are registered to vote! If you have moved or changed any of your contact information, you will need to get  this updated before voting!  In some cases, you MAY be able to register to vote online: [email or web address]       t

## 2022-11-17 ENCOUNTER — Other Ambulatory Visit: Payer: Self-pay

## 2022-11-17 ENCOUNTER — Encounter (HOSPITAL_COMMUNITY): Payer: Self-pay

## 2022-11-17 ENCOUNTER — Emergency Department (HOSPITAL_COMMUNITY)
Admission: EM | Admit: 2022-11-17 | Discharge: 2022-11-18 | Disposition: A | Discharge status: Home / Self Care | Payer: Commercial Managed Care - PPO | Attending: Emergency Medicine | Admitting: Emergency Medicine

## 2022-11-17 ENCOUNTER — Emergency Department (HOSPITAL_COMMUNITY): Payer: Commercial Managed Care - PPO

## 2022-11-17 DIAGNOSIS — R030 Elevated blood-pressure reading, without diagnosis of hypertension: Secondary | ICD-10-CM | POA: Insufficient documentation

## 2022-11-17 DIAGNOSIS — R0789 Other chest pain: Secondary | ICD-10-CM | POA: Insufficient documentation

## 2022-11-17 DIAGNOSIS — E039 Hypothyroidism, unspecified: Secondary | ICD-10-CM | POA: Insufficient documentation

## 2022-11-17 DIAGNOSIS — F17200 Nicotine dependence, unspecified, uncomplicated: Secondary | ICD-10-CM | POA: Insufficient documentation

## 2022-11-17 DIAGNOSIS — Z79899 Other long term (current) drug therapy: Secondary | ICD-10-CM | POA: Diagnosis not present

## 2022-11-17 DIAGNOSIS — R202 Paresthesia of skin: Secondary | ICD-10-CM | POA: Insufficient documentation

## 2022-11-17 DIAGNOSIS — E876 Hypokalemia: Secondary | ICD-10-CM | POA: Diagnosis not present

## 2022-11-17 LAB — CBC
HCT: 40.1 % (ref 36.0–46.0)
Hemoglobin: 12.8 g/dL (ref 12.0–15.0)
MCH: 26.7 pg (ref 26.0–34.0)
MCHC: 31.9 g/dL (ref 30.0–36.0)
MCV: 83.7 fL (ref 80.0–100.0)
Platelets: 306 10*3/uL (ref 150–400)
RBC: 4.79 MIL/uL (ref 3.87–5.11)
RDW: 13.8 % (ref 11.5–15.5)
WBC: 6.7 10*3/uL (ref 4.0–10.5)
nRBC: 0 % (ref 0.0–0.2)

## 2022-11-17 LAB — D-DIMER, QUANTITATIVE: D-Dimer, Quant: 0.27 ug/mL-FEU (ref 0.00–0.50)

## 2022-11-17 LAB — BASIC METABOLIC PANEL
Anion gap: 11 (ref 5–15)
BUN: 10 mg/dL (ref 6–20)
CO2: 24 mmol/L (ref 22–32)
Calcium: 8.7 mg/dL — ABNORMAL LOW (ref 8.9–10.3)
Chloride: 103 mmol/L (ref 98–111)
Creatinine, Ser: 0.75 mg/dL (ref 0.44–1.00)
GFR, Estimated: >60 mL/min (ref >60)
Glucose, Bld: 96 mg/dL (ref 70–99)
Potassium: 3.1 mmol/L — ABNORMAL LOW (ref 3.5–5.1)
Sodium: 138 mmol/L (ref 135–145)

## 2022-11-17 LAB — TROPONIN I (HIGH SENSITIVITY)
Troponin I (High Sensitivity): 7 ng/L (ref <18)
Troponin I (High Sensitivity): 8 ng/L (ref <18)

## 2022-11-17 NOTE — ED Triage Notes (Signed)
Pt bib ems from work; c/o cp x 1 hour, L sided near shoulder; no radiation; pressure; pt took 324 asa prior to ems arrival; 12 lead unremarkable; denies nausea, endorses some sob, denies diaphoresis; 20 ga lac; HR 90, 1450/90, 100% RA; denies cardiac hx

## 2022-11-17 NOTE — ED Provider Triage Note (Signed)
Emergency Medicine Provider Triage Evaluation Note  Nichole Beck , a 52 y.o. female  was evaluated in triage.  Pt complains of chest pain that started about an hour prior to arrival.  She describes it as pain in her left upper chest that is constant and intermittently radiating into her left arm, worse with certain movements and worse with deep breath.  No associated nausea or vomiting, she does report some shortness of breath and associated lightheadedness but no syncope.  No history of similar symptoms and no cardiac history.  Patient has a smoking history, denies history of hypertension, diabetes.  Patient denies any recent long distance travel or surgeries, no history of blood clots and no estrogen containing hormone therapy..  Review of Systems  Positive: Chest pain, shortness of breath, lightheadedness Negative: Nausea, vomiting, syncope, abdominal pain  Physical Exam  BP (!) 140/96   Pulse 73   Temp 98.1 F (36.7 C) (Oral)   Resp 19   LMP  (LMP Unknown)   SpO2 100%  Gen:   Awake, no distress   Resp:  Normal effort, CTA bilat, RRR, left upper chest tender to palpation MSK:   Moves extremities without difficulty  Other:    Medical Decision Making  Medically screening exam initiated at 4:46 PM.  Appropriate orders placed.  Carlean Purl was informed that the remainder of the evaluation will be completed by another provider, this initial triage assessment does not replace that evaluation, and the importance of remaining in the ED until their evaluation is complete.  Chest pain evaluation initiated   Legrand Rams 11/17/22 1648

## 2022-11-18 MED ORDER — KETOROLAC TROMETHAMINE 15 MG/ML IJ SOLN
15.0000 mg | Freq: Once | INTRAMUSCULAR | Status: AC
  Administered 2022-11-18: 15 mg via INTRAMUSCULAR
  Filled 2022-11-18: qty 1

## 2022-11-18 MED ORDER — POTASSIUM CHLORIDE CRYS ER 20 MEQ PO TBCR
40.0000 meq | EXTENDED_RELEASE_TABLET | Freq: Once | ORAL | Status: AC
  Administered 2022-11-18: 40 meq via ORAL
  Filled 2022-11-18: qty 2

## 2022-11-18 MED ORDER — TIZANIDINE HCL 4 MG PO TABS: 4.0000 mg | ORAL_TABLET | Freq: Four times a day (QID) | ORAL | 0 refills | Status: AC | PRN

## 2022-11-18 NOTE — ED Provider Notes (Signed)
[place] EMERGENCY DEPARTMENT AT [facility] Provider Note   CSN: [id number] Arrival date & time: 11/17/22  1630     History Chief Complaint  Patient presents with   Chest Pain    Nichole Beck is a 52 y.o. female with history of hypothyroidism presents emergency room today for evaluation of diffuse chest pain that is been since Thursday.  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  She had this pain similar last year.  She reports that she did have some tingling down her left arm that happens more whenever she moves her arms.  She denies any shortness of breath.  Not worse on exertion.  Worse with movement.  She denies any nausea, vomiting, dysuria, lightheadedness, dizziness, or headache.  No leg medication tried prior to arrival.  No known drug allergy.  She does smoke.  Denies any alcohol or illicit drug use.  Care delayed due to extended wait times in the ER.   Chest Pain Associated symptoms: no abdominal pain, no cough, no dizziness, no fever, no nausea, no shortness of breath and no vomiting        Home Medications Prior to Admission medications   Medication Sig Start Date End Date Taking? Authorizing Provider  Azelastine HCl 137 MCG/SPRAY SOLN Place 1 spray into both nostrils as directed. 11/10/22   Alfonse Spruce, MD  betamethasone valerate ointment (VALISONE) 0.1 % Apply 1 Application topically 2 (two) times daily as needed. 09/12/22   Burchette, Elberta Fortis, MD  ipratropium (ATROVENT) 0.03 % nasal spray Place 2 sprays into both nostrils 3 (three) times daily. 11/10/22   Alfonse Spruce, MD  levothyroxine (SYNTHROID) 100 MCG tablet Take 1 tablet (100 mcg total) by mouth daily. 11/03/22   Philip Aspen, Limmie Patricia, MD  Ruxolitinib Phosphate (OPZELURA) 1.5 % CREA Apply 1 Application topically 2 (two) times daily as needed. 11/10/22   Alfonse Spruce, MD      Allergies    Patient has no known allergies.    Review of Systems   Review of  Systems  Constitutional:  Negative for chills and fever.  HENT:  Negative for congestion and rhinorrhea.   Respiratory:  Negative for cough and shortness of breath.   Cardiovascular:  Positive for chest pain.  Gastrointestinal:  Negative for abdominal pain, constipation, diarrhea, nausea and vomiting.  Neurological:  Negative for dizziness and light-headedness.    Physical Exam Updated Vital Signs BP (!) 145/88 (BP Location: Left Arm)   Pulse 68   Temp 97.7 F (36.5 C) (Oral)   Resp 18   LMP  (LMP Unknown)   SpO2 98%  Physical Exam Vitals and nursing note reviewed.  Constitutional:      General: She is not in acute distress.    Appearance: Normal appearance. She is not ill-appearing or toxic-appearing.  HENT:     Head: Normocephalic and atraumatic.  Eyes:     General: No scleral icterus. Cardiovascular:     Rate and Rhythm: Normal rate and regular rhythm.     Pulses:          Radial pulses are 2+ on the right side and 2+ on the left side.       Dorsalis pedis pulses are 2+ on the right side and 2+ on the left side.       Posterior tibial pulses are 2+ on the right side and 2+ on the left side.  Pulmonary:     Effort: Pulmonary effort  is normal. No respiratory distress.     Breath sounds: Normal breath sounds.  Chest:     Chest wall: Tenderness present.     Comments: Diffuse anterior chest wall tenderness to palpation, L >R. No crepitus.  Abdominal:     General: Abdomen is flat.  Musculoskeletal:        General: No deformity.     Cervical back: Normal range of motion.     Right lower leg: No tenderness. No edema.     Left lower leg: No tenderness. No edema.  Skin:    General: Skin is warm and dry.  Neurological:     General: No focal deficit present.     Mental Status: She is alert. Mental status is at baseline.     ED Results / Procedures / Treatments   Labs (all labs ordered are listed, but only abnormal results are displayed) Labs Reviewed  BASIC METABOLIC  PANEL - Abnormal; Notable for the following components:      Result Value   Potassium 3.1 (*)    Calcium 8.7 (*)    All other components within normal limits  CBC  D-DIMER, QUANTITATIVE  TROPONIN I (HIGH SENSITIVITY)  TROPONIN I (HIGH SENSITIVITY)    EKG EKG Interpretation  Date/Time:  Thursday November 17 2022 16:18:09 EDT Ventricular Rate:  79 PR Interval:  166 QRS Duration: 66 QT Interval:  370 QTC Calculation: 424 R Axis:   64 Text Interpretation: Normal sinus rhythm Normal ECG When compared with ECG of 08-Nov-2021 12:09, PREVIOUS ECG IS PRESENT Confirmed by Gilda Crease [phone]) on 11/18/2022 3:43:18 AM  Radiology DG Chest 2 View  Result Date: 11/17/2022 CLINICAL DATA:  Chest pain. EXAM: CHEST - 2 VIEW COMPARISON:  Chest x-ray November 08, 2021. FINDINGS: The heart size and mediastinal contours are within normal limits. Both lungs are clear. No visible pleural effusions or pneumothorax. No acute osseous abnormality. IMPRESSION: No active cardiopulmonary disease. Electronically Signed   By: Feliberto Harts M.D.   On: 11/17/2022 17:06    Procedures Procedures  Medications Ordered in ED Medications  ketorolac (TORADOL) 15 MG/ML injection 15 mg (has no administration in time range)    ED Course/ Medical Decision Making/ A&P                           Medical Decision Making Amount and/or Complexity of Data Reviewed Labs: ordered. Radiology: ordered.  Risk Prescription drug management.   52 y.o. female presents to the ER for evaluation of chest pain. Differential diagnosis includes but is not limited to ACS, pericarditis, myocarditis, aortic dissection, PE, pneumothorax, esophageal spasm or rupture, chronic angina, pneumonia, bronchitis, GERD, reflux/PUD, biliary disease, pancreatitis, costochondritis, anxiety. Vital signs show mildly elevated BP, otherwise unremarkable. Physical exam as noted above.   Cardiac workup initiated in triage.  I independently reviewed  and interpreted the patient's labs.  Dimer unremarkable.  BMP shows potassium 3.1 and calcium 8.7 otherwise no electrolyte abnormality.  CBC without leukocytosis or anemia.  Troponin 7 with repeat at 8.  Chest x-ray shows no acute cardiopulmonary process.  EKG reviewed and interpreted by attending and read as normal sinus rhythm.  After consideration of the diagnostic results and the patients response to treatment, I feel that patient is stable for discharge home.  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  Her D-dimer is unremarkable, less likely PE, dissection,  or aneurysm.  Troponin is normal sinus rhythm with flat troponins.  Less likely ACS.  X-ray imaging does not show any signs of pneumonia, bronchitis, or pneumothorax.  Given the reproducibility of the nature as well as tenderness palpation, is likely costochondritis or some chest wall tenderness.  Will discharge the patient with muscle laxer's.  Recommended NSAIDs and Tylenol for pain.  We discussed massages as well as lidocaine patches.  We discussed the results of the labs/imaging. The plan is supportive care with pain management and lidocaine patches and muscle relaxers. We discussed strict return precautions and red flag symptoms. The patient verbalized their understanding and agrees to the plan. The patient is stable and being discharged home in good condition.  Portions of this report may have been transcribed using voice recognition software. Every effort was made to ensure accuracy; however, inadvertent computerized transcription errors may be present.   Final Clinical Impression(s) / ED Diagnoses Final diagnoses:  Chest wall pain  Hypokalemia    Rx / DC Orders ED Discharge Orders          Ordered    tiZANidine (ZANAFLEX) 4 MG tablet  Every 6 hours PRN        11/18/22 0340              Achille Rich, PA-C 11/18/22 0920    Gilda Crease, MD 11/23/22 [phone]

## 2022-11-18 NOTE — Discharge Instructions (Addendum)
You were seen in the ER today for evaluation of your chest pain.  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  I am prescribing you some muscle relaxers to take as needed.  Please do not drive or operate heavy machinery while on this medication as it can make you sleepy.  I also recommend doing at 1000 mg of Tylenol and 800 mg of ibuprofen every 6 hours as needed.  I would least do this for the next 24 hours to help with your pain.  You can also try lidocaine patches or ice or heat.  He can even try massages for your pain.  If you start to have shortness of breath, worsening chest pain, lightheadedness, leg swelling, fever, coughing up any blood, please return to the nearest emergency room for evaluation.  **Your potassium was low today. Please check with your PCP for re-check in the next few days.   Contact a doctor if: You have a fever. Your chest pain gets worse. You have new symptoms. Get help right away if: You feel sick to your stomach (nauseous) or you throw up (vomit). You feel sweaty or light-headed. You have a cough with mucus from your lungs (sputum) or you cough up blood. You are short of breath. These symptoms may be an emergency. Do not wait to see if the symptoms will go away. Get medical help right away. Call your local emergency services (911 in the [place] Do not drive yourself to the hospital.

## 2022-12-20 ENCOUNTER — Other Ambulatory Visit: Payer: Self-pay | Admitting: Internal Medicine

## 2023-01-04 ENCOUNTER — Ambulatory Visit (INDEPENDENT_AMBULATORY_CARE_PROVIDER_SITE_OTHER): Payer: Commercial Managed Care - PPO | Admitting: Internal Medicine

## 2023-01-04 ENCOUNTER — Other Ambulatory Visit: Payer: Self-pay | Admitting: *Deleted

## 2023-01-04 ENCOUNTER — Encounter: Payer: Self-pay | Admitting: Internal Medicine

## 2023-01-04 VITALS — BP 110/74 | HR 80 | Temp 98.3°F | Ht 66.0 in | Wt 159.8 lb

## 2023-01-04 DIAGNOSIS — E039 Hypothyroidism, unspecified: Secondary | ICD-10-CM | POA: Diagnosis not present

## 2023-01-04 DIAGNOSIS — E042 Nontoxic multinodular goiter: Secondary | ICD-10-CM | POA: Diagnosis not present

## 2023-01-04 DIAGNOSIS — Z1159 Encounter for screening for other viral diseases: Secondary | ICD-10-CM

## 2023-01-04 DIAGNOSIS — E559 Vitamin D deficiency, unspecified: Secondary | ICD-10-CM | POA: Diagnosis not present

## 2023-01-04 DIAGNOSIS — Z0000 Encounter for general adult medical examination without abnormal findings: Secondary | ICD-10-CM

## 2023-01-04 DIAGNOSIS — Z1231 Encounter for screening mammogram for malignant neoplasm of breast: Secondary | ICD-10-CM

## 2023-01-04 DIAGNOSIS — Z114 Encounter for screening for human immunodeficiency virus [HIV]: Secondary | ICD-10-CM

## 2023-01-04 DIAGNOSIS — B353 Tinea pedis: Secondary | ICD-10-CM

## 2023-01-04 LAB — LIPID PANEL
Cholesterol: 194 mg/dL (ref 0–200)
HDL: 58.1 mg/dL (ref >39.00)
LDL Cholesterol: 102 mg/dL — ABNORMAL HIGH (ref 0–99)
NonHDL: 136.29
Total CHOL/HDL Ratio: 3
Triglycerides: 169 mg/dL — ABNORMAL HIGH (ref 0.0–149.0)
VLDL: 33.8 mg/dL (ref 0.0–40.0)

## 2023-01-04 LAB — COMPREHENSIVE METABOLIC PANEL
ALT: 11 U/L (ref 0–35)
AST: 16 U/L (ref 0–37)
Albumin: 4 g/dL (ref 3.5–5.2)
Alkaline Phosphatase: 111 U/L (ref 39–117)
BUN: 12 mg/dL (ref 6–23)
CO2: 24 mEq/L (ref 19–32)
Calcium: 9.2 mg/dL (ref 8.4–10.5)
Chloride: 103 mEq/L (ref 96–112)
Creatinine, Ser: 0.82 mg/dL (ref 0.40–1.20)
GFR: 81.19 mL/min (ref >60.00)
Glucose, Bld: 74 mg/dL (ref 70–99)
Potassium: 3.6 mEq/L (ref 3.5–5.1)
Sodium: 140 mEq/L (ref 135–145)
Total Bilirubin: 0.5 mg/dL (ref 0.2–1.2)
Total Protein: 7.1 g/dL (ref 6.0–8.3)

## 2023-01-04 LAB — CBC WITH DIFFERENTIAL/PLATELET
Basophils Absolute: 0 10*3/uL (ref 0.0–0.1)
Basophils Relative: 0.4 % (ref 0.0–3.0)
Eosinophils Absolute: 0.3 10*3/uL (ref 0.0–0.7)
Eosinophils Relative: 3.5 % (ref 0.0–5.0)
HCT: 41.5 % (ref 36.0–46.0)
Hemoglobin: 13.6 g/dL (ref 12.0–15.0)
Lymphocytes Relative: 40.1 % (ref 12.0–46.0)
Lymphs Abs: 3.2 10*3/uL (ref 0.7–4.0)
MCHC: 32.8 g/dL (ref 30.0–36.0)
MCV: 82.5 fl (ref 78.0–100.0)
Monocytes Absolute: 0.6 10*3/uL (ref 0.1–1.0)
Monocytes Relative: 7 % (ref 3.0–12.0)
Neutro Abs: 3.9 10*3/uL (ref 1.4–7.7)
Neutrophils Relative %: 49 % (ref 43.0–77.0)
Platelets: 281 10*3/uL (ref 150.0–400.0)
RBC: 5.03 Mil/uL (ref 3.87–5.11)
RDW: 14.5 % (ref 11.5–15.5)
WBC: 7.9 10*3/uL (ref 4.0–10.5)

## 2023-01-04 LAB — VITAMIN B12: Vitamin B-12: 347 pg/mL (ref 211–911)

## 2023-01-04 LAB — VITAMIN D 25 HYDROXY (VIT D DEFICIENCY, FRACTURES): VITD: 16.09 ng/mL — ABNORMAL LOW (ref 30.00–100.00)

## 2023-01-04 LAB — TSH: TSH: 5.88 u[IU]/mL — ABNORMAL HIGH (ref 0.35–5.50)

## 2023-01-04 MED ORDER — BETAMETHASONE VALERATE 0.1 % EX OINT: 1.0000 | TOPICAL_OINTMENT | Freq: Two times a day (BID) | CUTANEOUS | 1 refills | Status: DC | PRN

## 2023-01-04 NOTE — Progress Notes (Signed)
Established Patient Office Visit     CC/Reason for Visit: Annual preventive exam and follow-up chronic conditions  HPI: Nichole Beck is a 52 y.o. female who is coming in today for the above mentioned reasons. Past Medical History is significant for: Hypothyroidism, thyroid nodules.  She is feeling well.  She would like me to look at some lesions that she has in between the toes of both feet.  She has routine eye and dental care.  She is due for flu and COVID vaccines.  She is due for mammogram.   Past Medical/Surgical History: Past Medical History:  Diagnosis Date   Abnormal Pap smear of cervix    many yrs ago   Anemia    Hypothyroidism    Nicotine dependence    STD (sexually transmitted disease)    gonorrhea treated   Urticaria     Past Surgical History:  Procedure Laterality Date   BREAST BIOPSY     COLPOSCOPY     TUBAL LIGATION      Social History:  reports that she has been smoking cigarettes. She has a 16.50 pack-year smoking history. She has never been exposed to tobacco smoke. She has never used smokeless tobacco. She reports current alcohol use. She reports that she does not use drugs.  Allergies: No Known Allergies  Family History:  Family History  Problem Relation Age of Onset   Cancer Maternal Grandmother    Diabetes Maternal Grandmother    Cancer Paternal Grandmother    Diabetes Paternal Grandmother    Asthma Daughter    Breast cancer Neg Hx    Colon polyps Neg Hx    Colon cancer Neg Hx    Esophageal cancer Neg Hx    Rectal cancer Neg Hx    Stomach cancer Neg Hx      Current Outpatient Medications:    Azelastine HCl 137 MCG/SPRAY SOLN, Place 1 spray into both nostrils as directed., Disp: 30 mL, Rfl: 5   betamethasone valerate ointment (VALISONE) 0.1 %, Apply 1 Application topically 2 (two) times daily as needed., Disp: 30 g, Rfl: 1   ipratropium (ATROVENT) 0.03 % nasal spray, Place 2 sprays into both nostrils 3 (three) times daily., Disp: 30  mL, Rfl: 5   levothyroxine (SYNTHROID) 100 MCG tablet, Take 1 tablet by mouth once daily, Disp: 30 tablet, Rfl: 0   Ruxolitinib Phosphate (OPZELURA) 1.5 % CREA, Apply 1 Application topically 2 (two) times daily as needed., Disp: 60 g, Rfl: 5   tiZANidine (ZANAFLEX) 4 MG tablet, Take 1 tablet (4 mg total) by mouth every 6 (six) hours as needed for muscle spasms., Disp: 10 tablet, Rfl: 0  Current Facility-Administered Medications:    0.9 %  sodium chloride infusion, 500 mL, Intravenous, Once, Jenel Lucks, MD  Review of Systems:  Negative unless indicated in HPI.   Physical Exam: Vitals:   01/04/23 1100  BP: 110/74  Pulse: 80  Temp: 98.3 F (36.8 C)  TempSrc: Oral  SpO2: 98%  Weight: 159 lb 12.8 oz (72.5 kg)  Height: 5\' 6"  (1.676 m)    Body mass index is 25.79 kg/m.   Physical Exam Vitals reviewed.  Constitutional:      General: She is not in acute distress.    Appearance: Normal appearance. She is not ill-appearing, toxic-appearing or diaphoretic.  HENT:     Head: Normocephalic.     Right Ear: Tympanic membrane, ear canal and external ear normal. There is no impacted cerumen.  Left Ear: Tympanic membrane, ear canal and external ear normal. There is no impacted cerumen.     Nose: Nose normal.     Mouth/Throat:     Mouth: Mucous membranes are moist.     Pharynx: Oropharynx is clear. No oropharyngeal exudate or posterior oropharyngeal erythema.  Eyes:     General: No scleral icterus.       Right eye: No discharge.        Left eye: No discharge.     Conjunctiva/sclera: Conjunctivae normal.     Pupils: Pupils are equal, round, and reactive to light.  Neck:     Vascular: No carotid bruit.  Cardiovascular:     Rate and Rhythm: Normal rate and regular rhythm.     Pulses: Normal pulses.     Heart sounds: Normal heart sounds.  Pulmonary:     Effort: Pulmonary effort is normal. No respiratory distress.     Breath sounds: Normal breath sounds.  Abdominal:      General: Abdomen is flat. Bowel sounds are normal.     Palpations: Abdomen is soft.  Musculoskeletal:        General: Normal range of motion.     Cervical back: Normal range of motion.  Skin:    General: Skin is warm and dry.  Neurological:     General: No focal deficit present.     Mental Status: She is alert and oriented to person, place, and time. Mental status is at baseline.  Psychiatric:        Mood and Affect: Mood normal.        Behavior: Behavior normal.        Thought Content: Thought content normal.        Judgment: Judgment normal.     Flowsheet Row Office Visit from 01/04/2023 in [facility] [place]  PHQ-9 Total Score 0       Impression and Plan:  Encounter for preventive health examination -     CBC with Differential/Platelet; Future -     Comprehensive metabolic panel; Future -     Lipid panel; Future  Hypothyroidism, unspecified type -     TSH; Future -     Vitamin B12; Future  Vitamin D deficiency -     VITAMIN D 25 Hydroxy (Vit-D Deficiency, Fractures); Future  Multiple thyroid nodules -     US THYROID; Future  Tinea pedis of both feet  Encounter for hepatitis C screening test for low risk patient -     Hepatitis C antibody; Future  Encounter for screening for HIV -     HIV Antibody (routine testing w rflx); Future   -Recommend routine eye and dental care. -Healthy lifestyle discussed in detail. -Labs to be updated today. -Prostate cancer screening: N/A Health Maintenance  Topic Date Due   HIV Screening  Never done   Hepatitis C Screening  Never done   COVID-19 Vaccine (1) 11/21/2023*   Mammogram  02/05/2023   Flu Shot  03/02/2023   Pap Smear  01/02/2024   DTaP/Tdap/Td vaccine (2 - Td or Tdap) 02/20/2031   Colon Cancer Screening  05/18/2031   Zoster (Shingles) Vaccine  Completed   HPV Vaccine  Aged Out  *Topic was postponed. The date shown is not the original due date.     -Mammogram requested. -Thyroid  ultrasound ordered to follow-up on thyroid nodules.   -Overdue for flu and COVID vaccines but she declines today despite counseling. -Lamisil cream or Tinactin spray  advised to use on fungal infection of feet.     Nichole Jan, MD [facility]

## 2023-01-04 NOTE — Addendum Note (Signed)
Addended by: Kern Reap B on: 01/04/2023 11:35 AM   Modules accepted: Orders

## 2023-01-05 ENCOUNTER — Encounter: Payer: Self-pay | Admitting: Internal Medicine

## 2023-01-05 ENCOUNTER — Other Ambulatory Visit: Payer: Self-pay | Admitting: Internal Medicine

## 2023-01-05 DIAGNOSIS — E559 Vitamin D deficiency, unspecified: Secondary | ICD-10-CM | POA: Insufficient documentation

## 2023-01-05 DIAGNOSIS — E039 Hypothyroidism, unspecified: Secondary | ICD-10-CM

## 2023-01-05 LAB — HEPATITIS C ANTIBODY: Hepatitis C Ab: NONREACTIVE

## 2023-01-05 LAB — HIV ANTIBODY (ROUTINE TESTING W REFLEX): HIV 1&2 Ab, 4th Generation: NONREACTIVE

## 2023-01-05 MED ORDER — VITAMIN D (ERGOCALCIFEROL) 1.25 MG (50000 UNIT) PO CAPS
50000.0000 [IU] | ORAL_CAPSULE | ORAL | 0 refills | Status: DC
Start: 2023-01-05 — End: 2023-03-02

## 2023-01-05 MED ORDER — LEVOTHYROXINE SODIUM 125 MCG PO TABS: 125.0000 ug | ORAL_TABLET | Freq: Every day | ORAL | 0 refills | Status: DC

## 2023-01-11 ENCOUNTER — Ambulatory Visit (HOSPITAL_BASED_OUTPATIENT_CLINIC_OR_DEPARTMENT_OTHER)
Admission: RE | Admit: 2023-01-11 | Discharge: 2023-01-11 | Disposition: A | Discharge status: Home / Self Care | Payer: Commercial Managed Care - PPO | Source: Ambulatory Visit | Attending: Internal Medicine | Admitting: Internal Medicine

## 2023-01-11 DIAGNOSIS — E042 Nontoxic multinodular goiter: Secondary | ICD-10-CM | POA: Insufficient documentation

## 2023-02-15 ENCOUNTER — Ambulatory Visit
Admission: RE | Admit: 2023-02-15 | Discharge: 2023-02-15 | Disposition: A | Discharge status: Home / Self Care | Payer: Commercial Managed Care - PPO | Source: Ambulatory Visit | Attending: Internal Medicine | Admitting: Internal Medicine

## 2023-02-15 DIAGNOSIS — Z1231 Encounter for screening mammogram for malignant neoplasm of breast: Secondary | ICD-10-CM

## 2023-02-17 ENCOUNTER — Other Ambulatory Visit: Payer: Self-pay | Admitting: Internal Medicine

## 2023-02-17 DIAGNOSIS — R928 Other abnormal and inconclusive findings on diagnostic imaging of breast: Secondary | ICD-10-CM

## 2023-02-22 ENCOUNTER — Ambulatory Visit: Admission: RE | Admit: 2023-02-22 | Payer: Commercial Managed Care - PPO | Source: Ambulatory Visit

## 2023-02-22 ENCOUNTER — Ambulatory Visit
Admission: RE | Admit: 2023-02-22 | Discharge: 2023-02-22 | Disposition: A | Discharge status: Home / Self Care | Payer: Commercial Managed Care - PPO | Source: Ambulatory Visit | Attending: Internal Medicine | Admitting: Internal Medicine

## 2023-02-22 ENCOUNTER — Other Ambulatory Visit: Payer: Self-pay | Admitting: Internal Medicine

## 2023-02-22 DIAGNOSIS — R928 Other abnormal and inconclusive findings on diagnostic imaging of breast: Secondary | ICD-10-CM

## 2023-02-22 DIAGNOSIS — R921 Mammographic calcification found on diagnostic imaging of breast: Secondary | ICD-10-CM

## 2023-03-01 ENCOUNTER — Other Ambulatory Visit (INDEPENDENT_AMBULATORY_CARE_PROVIDER_SITE_OTHER): Payer: Commercial Managed Care - PPO

## 2023-03-01 DIAGNOSIS — E039 Hypothyroidism, unspecified: Secondary | ICD-10-CM

## 2023-03-01 DIAGNOSIS — E559 Vitamin D deficiency, unspecified: Secondary | ICD-10-CM

## 2023-03-02 ENCOUNTER — Other Ambulatory Visit: Payer: Self-pay | Admitting: Internal Medicine

## 2023-03-02 DIAGNOSIS — E559 Vitamin D deficiency, unspecified: Secondary | ICD-10-CM

## 2023-03-02 MED ORDER — VITAMIN D (ERGOCALCIFEROL) 1.25 MG (50000 UNIT) PO CAPS
50000.0000 [IU] | ORAL_CAPSULE | ORAL | 0 refills | Status: AC
Start: 2023-03-02 — End: 2023-05-19

## 2023-03-05 ENCOUNTER — Other Ambulatory Visit: Payer: Self-pay | Admitting: Internal Medicine

## 2023-03-08 ENCOUNTER — Ambulatory Visit (INDEPENDENT_AMBULATORY_CARE_PROVIDER_SITE_OTHER): Payer: Commercial Managed Care - PPO | Admitting: *Deleted

## 2023-03-08 ENCOUNTER — Telehealth (INDEPENDENT_AMBULATORY_CARE_PROVIDER_SITE_OTHER): Payer: Commercial Managed Care - PPO | Admitting: Family Medicine

## 2023-03-08 VITALS — Wt 159.0 lb

## 2023-03-08 DIAGNOSIS — U071 COVID-19: Secondary | ICD-10-CM | POA: Diagnosis not present

## 2023-03-08 DIAGNOSIS — R059 Cough, unspecified: Secondary | ICD-10-CM

## 2023-03-08 LAB — POC COVID19 BINAXNOW: SARS Coronavirus 2 Ag: POSITIVE — AB

## 2023-03-08 NOTE — Progress Notes (Signed)
Patient ID: Nichole Beck, female   DOB: [birth year], 52 y.o.   MRN: [id number]   Virtual Visit via Video Note  I connected with Nichole Beck on 03/08/23 at  4:30 PM EDT by a video enabled telemedicine application and verified that I am speaking with the correct person using two identifiers.  Location patient: home Location provider:work or home office Persons participating in the virtual visit: patient, provider  I discussed the limitations of evaluation and management by telemedicine and the availability of in person appointments. The patient expressed understanding and agreed to proceed.   HPI: Nichole Beck is seen with onset late Sunday with cough, headache, body aches, congestion.  She feels some better today.  She has had some fatigue which is gradually improving.  Tested positive for COVID earlier today.  This will be day 4.  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  Denies any nausea, vomiting, or diarrhea.  No dyspnea.  She has history of hypothyroidism but otherwise no active medical problems.   ROS: See pertinent positives and negatives per HPI.  Past Medical History:  Diagnosis Date   Abnormal Pap smear of cervix    many yrs ago   Anemia    Hypothyroidism    Nicotine dependence    STD (sexually transmitted disease)    gonorrhea treated   Urticaria     Past Surgical History:  Procedure Laterality Date   BREAST BIOPSY     COLPOSCOPY     TUBAL LIGATION      Family History  Problem Relation Age of Onset   Cancer Maternal Grandmother    Diabetes Maternal Grandmother    Cancer Paternal Grandmother    Diabetes Paternal Grandmother    Asthma Daughter    Breast cancer Neg Hx    Colon polyps Neg Hx    Colon cancer Neg Hx    Esophageal cancer Neg Hx    Rectal cancer Neg Hx    Stomach cancer Neg Hx     SOCIAL HX: Non-smoker   Current Outpatient Medications:    Azelastine HCl 137 MCG/SPRAY SOLN, Place 1 spray into both nostrils as directed., Disp: 30 mL,  Rfl: 5   betamethasone valerate ointment (VALISONE) 0.1 %, APPLY  OINTMENT TO AFFECTED AREA TWICE DAILY AS NEEDED, Disp: 30 g, Rfl: 0   ipratropium (ATROVENT) 0.03 % nasal spray, Place 2 sprays into both nostrils 3 (three) times daily., Disp: 30 mL, Rfl: 5   levothyroxine (SYNTHROID) 125 MCG tablet, Take 1 tablet (125 mcg total) by mouth daily., Disp: 90 tablet, Rfl: 0   Ruxolitinib Phosphate (OPZELURA) 1.5 % CREA, Apply 1 Application topically 2 (two) times daily as needed., Disp: 60 g, Rfl: 5   tiZANidine (ZANAFLEX) 4 MG tablet, Take 1 tablet (4 mg total) by mouth every 6 (six) hours as needed for muscle spasms., Disp: 10 tablet, Rfl: 0   Vitamin D, Ergocalciferol, (DRISDOL) 1.25 MG (50000 UNIT) CAPS capsule, Take 1 capsule (50,000 Units total) by mouth every 7 (seven) days for 12 doses., Disp: 12 capsule, Rfl: 0  Current Facility-Administered Medications:    0.9 %  sodium chloride infusion, 500 mL, Intravenous, Once, Cunningham, Dub Amis, MD  EXAM:  VITALS per patient if applicable:  GENERAL: alert, oriented, appears well and in no acute distress  HEENT: atraumatic, conjunttiva clear, no obvious abnormalities on inspection of external nose and ears  NECK: normal movements of the head and neck  LUNGS: on inspection no signs of  respiratory distress, breathing rate appears normal, no obvious gross SOB, gasping or wheezing  CV: no obvious cyanosis  MS: moves all visible extremities without noticeable abnormality  PSYCH/NEURO: pleasant and cooperative, no obvious depression or anxiety, speech and thought processing grossly intact  ASSESSMENT AND PLAN:  Discussed the following assessment and plan:  COVID infection.  Patient is on day 4.  Improving somewhat.  We did discuss antiviral therapy but she would like to observe at this point since she is making some progress and with relatively mild symptoms.  -We did write work note for her to be out remainder this week through Monday.   Recommend additional few days after she goes back to work of mask use.     I discussed the assessment and treatment plan with the patient. The patient was provided an opportunity to ask questions and all were answered. The patient agreed with the plan and demonstrated an understanding of the instructions.   The patient was advised to call back or seek an in-person evaluation if the symptoms worsen or if the condition fails to improve as anticipated.     Evelena Peat, MD

## 2023-03-08 NOTE — Progress Notes (Signed)
 Per patient no change in vitals since last visit, unable to obtain new vitals due to telehealth visit

## 2023-03-31 ENCOUNTER — Other Ambulatory Visit: Payer: Self-pay | Admitting: Internal Medicine

## 2023-03-31 DIAGNOSIS — E039 Hypothyroidism, unspecified: Secondary | ICD-10-CM

## 2023-04-17 ENCOUNTER — Other Ambulatory Visit: Payer: Self-pay | Admitting: Internal Medicine

## 2023-04-19 ENCOUNTER — Other Ambulatory Visit: Payer: Commercial Managed Care - PPO

## 2023-04-20 ENCOUNTER — Other Ambulatory Visit: Payer: Commercial Managed Care - PPO

## 2023-04-20 ENCOUNTER — Encounter: Payer: Self-pay | Admitting: Internal Medicine

## 2023-04-20 ENCOUNTER — Other Ambulatory Visit: Payer: Self-pay | Admitting: Internal Medicine

## 2023-04-20 DIAGNOSIS — E559 Vitamin D deficiency, unspecified: Secondary | ICD-10-CM

## 2023-06-08 ENCOUNTER — Ambulatory Visit: Payer: Commercial Managed Care - PPO | Admitting: Allergy & Immunology

## 2023-06-13 ENCOUNTER — Encounter: Payer: Self-pay | Admitting: Internal Medicine

## 2023-06-13 ENCOUNTER — Ambulatory Visit (INDEPENDENT_AMBULATORY_CARE_PROVIDER_SITE_OTHER): Payer: Commercial Managed Care - PPO | Admitting: Internal Medicine

## 2023-06-13 ENCOUNTER — Other Ambulatory Visit: Payer: Self-pay

## 2023-06-13 VITALS — BP 130/70 | HR 76 | Temp 98.1°F | Resp 19 | Wt 165.4 lb

## 2023-06-13 DIAGNOSIS — J3089 Other allergic rhinitis: Secondary | ICD-10-CM

## 2023-06-13 DIAGNOSIS — J302 Other seasonal allergic rhinitis: Secondary | ICD-10-CM

## 2023-06-13 DIAGNOSIS — R1319 Other dysphagia: Secondary | ICD-10-CM | POA: Diagnosis not present

## 2023-06-13 DIAGNOSIS — R09A2 Foreign body sensation, throat: Secondary | ICD-10-CM

## 2023-06-13 DIAGNOSIS — L309 Dermatitis, unspecified: Secondary | ICD-10-CM

## 2023-06-13 MED ORDER — TRIAMCINOLONE ACETONIDE 0.1 % EX OINT: TOPICAL_OINTMENT | CUTANEOUS | 5 refills | Status: DC

## 2023-06-13 MED ORDER — TACROLIMUS 0.1 % EX OINT: TOPICAL_OINTMENT | Freq: Two times a day (BID) | CUTANEOUS | 0 refills | Status: DC

## 2023-06-13 MED ORDER — IPRATROPIUM BROMIDE 0.03 % NA SOLN: 2.0000 | Freq: Three times a day (TID) | NASAL | 5 refills | Status: AC

## 2023-06-13 NOTE — Progress Notes (Signed)
FOLLOW UP Date of Service/Encounter:  06/13/23   Subjective:  Nichole Beck (DOB: 11-Dec-1970) is a 52 y.o. female who [facility] for follow up for allergic rhinitis, rashes.   History obtained from: chart review and patient. Last seen by Dr Dellis Anes about 6 months ago with rashes, started on Opzelura.  Also having trouble with allergies; has tried Flonase, [employer], Azelastine, Nasacort, Allegra, Singulair without improvement.  On Atrovent.  Not interested in AIT. Also with dysphagia, discussed second opinion with ENT.  Reports not much congestion, drainage, runny nose. Still has swallowing issues and feels like something is stuck in her throat. Has frequent throat clearing.  Did try Prilosec in the past with GI for GERD and did not notice any improvement. Has not seen ENT for second opinion. Using PRN Atrovent which does help with the drainage.   Still having rashes, mostly on palms of hands but sometimes on legs. It is red, dry, itchy.  Opzelura was too expensive. Has tried topical steroids betamethasone that helped somewhat. She is a bus Hospital doctor.  Uses hand sanitizer also.   Past Medical History: Past Medical History:  Diagnosis Date   Abnormal Pap smear of cervix    many yrs ago   Anemia    Hypothyroidism    Nicotine dependence    STD (sexually transmitted disease)    gonorrhea treated   Urticaria     Objective:  BP 130/70 (BP Location: Right Arm, Patient Position: Sitting, Cuff Size: Normal)   Pulse 76   Temp 98.1 F (36.7 C) (Temporal)   Resp 19   Wt 165 lb 6.4 oz (75 kg)   LMP  (LMP Unknown)   SpO2 100%   BMI 26.70 kg/m  Body mass index is 26.7 kg/m. Physical Exam: GEN: alert, well developed HEENT: clear conjunctiva, nose with mild inferior turbinate hypertrophy, pink nasal mucosa, clear rhinorrhea HEART: regular rate and rhythm, no murmur LUNGS: clear to auscultation bilaterally, no coughing, unlabored respiration SKIN:  dry patches in the center of bilateral palms   Assessment:   1. Seasonal and perennial allergic rhinitis   2. Globus sensation   3. Other dysphagia   4. Hand eczema     Plan/Recommendations:  Seasonal and perennial allergic rhinitis (grasses, ragweed, weeds, trees, dust mites, cat, dog, cockroach, and tobacco) - Use nasal saline rinses with distilled water as needed.  Try the [employer] sinus rinse bottle.  - Continue taking: Atrovent (ipratropium) 0.03% one spray per nostril 2-3 times daily as needed (CAN BE OVER DRYING) - Has tried Flonase, Xhance, Azelastine, Nasacort, Allegra, Singulair without improvement.   Dysphagia, Globus Sensation  - Avoid throat clearing.  Instead take small sip of water.   - Has seen GI in the past. Tried PPI/Pepcid without improvement.  - Consider second opinion with ENT.  If needed, call Dr. Avel Sensor office to schedule an appointment [phone] or consider seeing another physician at [facility] where you previously were seen.   Possible Eczema Hand Dermatitis  - Do a daily soaking tub bath in warm water for 10-15 minutes.  - Use a gentle, unscented cleanser at the end of the bath (such as Dove unscented bar or baby wash, or Aveeno sensitive body wash). Then rinse, pat half-way dry, and apply a gentle, unscented moisturizer cream or ointment (Cerave, Cetaphil, Eucerin, Aveeno, Vaseline)  all over while still damp. Dry skin makes the itching and rash of eczema worse. The skin should be moisturized with  a gentle, unscented moisturizer at least twice daily.  - Use only unscented liquid laundry detergent. - Apply prescribed topical steroid (triamcinolone 0.1% below neck) to flared areas (red and thickened eczema) after the moisturizer has soaked into the skin (wait at least 30 minutes). Taper off the topical steroids as the skin improves. Do not use topical steroid for more than 7-10 days at a time.  - Put Protopic 0.1% onto areas of rough eczema twice a day. May  decrease to once a day as the eczema improves. This will not thin the skin, and is safe for chronic use. Do not put this onto normal appearing skin.     Return in about 6 months (around 12/11/2023).  Alesia Morin, MD [facility]

## 2023-06-13 NOTE — Patient Instructions (Addendum)
Seasonal and perennial allergic rhinitis (grasses, ragweed, weeds, trees, dust mites, cat, dog, cockroach, and tobacco) - Use nasal saline rinses with distilled water as needed.  Try the Neilmed sinus rinse bottle.  - Continue taking: Atrovent (ipratropium) 0.03% one spray per nostril 2-3 times daily as needed (CAN BE OVER DRYING)  Dysphagia, Globus Sensation  - Avoid throat clearing.  Instead take small sip of water.   - Consider second opinion with ENT.  If needed, call Dr. Avel Sensor office to schedule an appointment [phone] or consider seeing another physician at [facility] where you previously were seen.   Possible Eczema Hand Dermatitis  - Do a daily soaking tub bath in warm water for 10-15 minutes.  - Use a gentle, unscented cleanser at the end of the bath (such as Dove unscented bar or baby wash, or Aveeno sensitive body wash). Then rinse, pat half-way dry, and apply a gentle, unscented moisturizer cream or ointment (Cerave, Cetaphil, Eucerin, Aveeno, Vaseline)  all over while still damp. Dry skin makes the itching and rash of eczema worse. The skin should be moisturized with a gentle, unscented moisturizer at least twice daily.  - Use only unscented liquid laundry detergent. - Apply prescribed topical steroid (triamcinolone 0.1% below neck) to flared areas (red and thickened eczema) after the moisturizer has soaked into the skin (wait at least 30 minutes). Taper off the topical steroids as the skin improves. Do not use topical steroid for more than 7-10 days at a time.  - Put Protopic 0.1% onto areas of rough eczema twice a day. May decrease to once a day as the eczema improves. This will not thin the skin, and is safe for chronic use. Do not put this onto normal appearing skin.

## 2023-06-26 ENCOUNTER — Other Ambulatory Visit: Payer: Self-pay | Admitting: Internal Medicine

## 2023-06-26 DIAGNOSIS — E559 Vitamin D deficiency, unspecified: Secondary | ICD-10-CM

## 2023-07-14 ENCOUNTER — Other Ambulatory Visit: Payer: Self-pay

## 2023-07-14 NOTE — Telephone Encounter (Signed)
Received on base communication from [facility] on [address] in [place] for tacrolimus 0.1% oint there is still 5 refills on patients rx left

## 2023-07-24 MED ORDER — TACROLIMUS 0.1 % EX OINT: TOPICAL_OINTMENT | CUTANEOUS | 2 refills | Status: AC

## 2023-07-24 NOTE — Addendum Note (Signed)
Addended by: Areta Haber B on: 07/24/2023 06:36 PM   Modules accepted: Orders

## 2023-07-24 NOTE — Telephone Encounter (Signed)
Previous notation is incorrect - Triamcinolone has 5 RF not Tacrolimus.  Sent in medication refill on Tacrolimus 0.1% ointment to [facility]

## 2023-09-01 ENCOUNTER — Ambulatory Visit
Admission: RE | Admit: 2023-09-01 | Discharge: 2023-09-01 | Disposition: A | Discharge status: Home / Self Care | Payer: Commercial Managed Care - PPO | Source: Ambulatory Visit | Attending: Internal Medicine | Admitting: Internal Medicine

## 2023-09-01 DIAGNOSIS — R921 Mammographic calcification found on diagnostic imaging of breast: Secondary | ICD-10-CM

## 2023-09-02 ENCOUNTER — Encounter: Payer: Self-pay | Admitting: Internal Medicine

## 2023-09-04 ENCOUNTER — Other Ambulatory Visit: Payer: Self-pay | Admitting: Internal Medicine

## 2023-09-04 DIAGNOSIS — R921 Mammographic calcification found on diagnostic imaging of breast: Secondary | ICD-10-CM

## 2023-09-07 ENCOUNTER — Ambulatory Visit: Payer: Commercial Managed Care - PPO | Admitting: Family Medicine

## 2023-09-07 ENCOUNTER — Encounter: Payer: Self-pay | Admitting: Family Medicine

## 2023-09-07 VITALS — BP 114/82 | HR 90 | Temp 98.5°F | Wt 159.8 lb

## 2023-09-07 DIAGNOSIS — J101 Influenza due to other identified influenza virus with other respiratory manifestations: Secondary | ICD-10-CM | POA: Diagnosis not present

## 2023-09-07 LAB — POCT INFLUENZA A/B
Influenza A, POC: POSITIVE — AB
Influenza B, POC: NEGATIVE

## 2023-09-07 LAB — POC COVID19 BINAXNOW: SARS Coronavirus 2 Ag: NEGATIVE

## 2023-09-07 NOTE — Patient Instructions (Signed)
 You were seen for influenza.   Please be sure to drink plenty of fluids.   You may take the following OTC medications to help with symptoms:  For cough, use  cough syrups or other cough suppressants.  For headache, sore throat, fevers, muscle aches, chills, other pain, take ibuprofen  or tylenol   For congestion, use nasal sprays, decongestants, or antihistamines  Please follow up if no improvement.   Go to ED if you have severe chest pain, fevers, shortness of breath or other worrisome symptoms.

## 2023-09-07 NOTE — Progress Notes (Signed)
 Assessment/Plan:   Assessment and Plan    Influenza   Presents with symptoms consistent with influenza, including myalgia, fatigue, congestion, headache, chills, epistaxis, cough, and diarrhea. Symptoms began four days ago. Flu test faintly positive, high community prevalence. Antiviral therapy not effective due to symptom duration. Vital signs stable, no chest pain or dyspnea. Discussed supportive care options, emphasizing that prescription medications may have more side effects without added benefit. Advised on hydration and rest. Explained that symptoms typically improve within five to seven days, with current symptoms likely at her peak.   - Recommend supportive care including acetaminophen  and ibuprofen  for myalgia and fever   - Advise rest and increased fluid intake   - Suggest nasal rinses and decongestants for congestion   - Recommend over-the-counter cough suppressants such as Nyquil or Mucinex D   - Advise against antidiarrheals unless necessary, with options like loperamide or bismuth subsalicylate if diarrhea is severe   - Provide a work note for the rest of the week   - Instruct to seek medical attention if severe symptoms such as high fever, abdominal pain, or dyspnea occur.        There are no discontinued medications.  Return if symptoms worsen or fail to improve.    Subjective:   Encounter date: 09/07/2023  Nichole Beck is a 53 y.o. female who has Hypothyroidism; Multiple thyroid  nodules; Bilateral hand pain; Dysphagia; Vitamin D  deficiency; and Influenza A on their problem list..   She  has a past medical history of Abnormal Pap smear of cervix, Anemia, Hypothyroidism, Nicotine dependence, STD (sexually transmitted disease), and Urticaria..   She presents with chief complaint of Nasal Congestion (Body aches, fatigue, congestion, headache, chills, nose bleed, cough x Monday) .   Discussed the use of AI scribe software for clinical note transcription with the  patient, who gave verbal consent to proceed.  History of Present Illness   Nichole Beck is a 53 year old female who presents with body aches, fatigue, congestion, and headache.  She has been experiencing body aches, fatigue, congestion, headache, chills, epistaxis, and cough since Monday night. The body aches are described as 'wholesome' and primarily located in the leg area. She experienced epistaxis upon waking up this morning. The chills are intermittent.  No chest pain or shortness of breath. She notes cramping in the upper stomach area when bending down and reports diarrhea. No nausea or vomiting.  For symptom management, she is taking vitamin C and drinking tea with lemon. She has not used any over-the-counter medications such as Tylenol  or ibuprofen . She notes that her symptoms were most severe on Monday night and Tuesday, with some improvement since then.         Past Surgical History:  Procedure Laterality Date   BREAST BIOPSY     COLPOSCOPY     TUBAL LIGATION      Outpatient Medications Prior to Visit  Medication Sig Dispense Refill   betamethasone  valerate ointment (VALISONE ) 0.1 % APPLY  OINTMENT TO AFFECTED AREA TWICE DAILY AS NEEDED 30 g 0   ipratropium (ATROVENT ) 0.03 % nasal spray Place 2 sprays into both nostrils 3 (three) times daily. 30 mL 5   levothyroxine  (SYNTHROID ) 125 MCG tablet Take 1 tablet by mouth once daily 90 tablet 1   tacrolimus  (PROTOPIC ) 0.1 % ointment Apply topically to areas of rough eczema 2 times a day. 100 g 2   triamcinolone  ointment (KENALOG ) 0.1 % Apply twice daily for flare ups below neck, maximum 10  days. 80 g 5   tiZANidine  (ZANAFLEX ) 4 MG tablet Take 1 tablet (4 mg total) by mouth every 6 (six) hours as needed for muscle spasms. (Patient not taking: Reported on 09/07/2023) 10 tablet 0   Vitamin D , Ergocalciferol , (DRISDOL ) 1.25 MG (50000 UNIT) CAPS capsule Take 50,000 Units by mouth once a week. (Patient not taking: Reported on 09/07/2023)      Facility-Administered Medications Prior to Visit  Medication Dose Route Frequency Provider Last Rate Last Admin   0.9 %  sodium chloride  infusion  500 mL Intravenous Once Stacia Glendia BRAVO, MD        Family History  Problem Relation Age of Onset   Cancer Maternal Grandmother    Diabetes Maternal Grandmother    Cancer Paternal Grandmother    Diabetes Paternal Grandmother    Asthma Daughter    Breast cancer Neg Hx    Colon polyps Neg Hx    Colon cancer Neg Hx    Esophageal cancer Neg Hx    Rectal cancer Neg Hx    Stomach cancer Neg Hx     Social History   Socioeconomic History   Marital status: Single    Spouse name: Not on file   Number of children: Not on file   Years of education: Not on file   Highest education level: Not on file  Occupational History   Not on file  Tobacco Use   Smoking status: Every Day    Current packs/day: 0.50    Average packs/day: 0.5 packs/day for 33.0 years (16.5 ttl pk-yrs)    Types: Cigarettes    Passive exposure: Never   Smokeless tobacco: Never  Vaping Use   Vaping status: Never Used  Substance and Sexual Activity   Alcohol use: Yes    Comment: occ   Drug use: Never   Sexual activity: Not Currently    Birth control/protection: Post-menopausal  Other Topics Concern   Not on file  Social History Narrative   Not on file   Social Drivers of Health   Financial Resource Strain: Not on file  Food Insecurity: Not on file  Transportation Needs: Not on file  Physical Activity: Not on file  Stress: Not on file  Social Connections: Not on file  Intimate Partner Violence: Not on file                                                                                                  Objective:  Physical Exam: BP 114/82   Pulse 90   Temp 98.5 F (36.9 C) (Oral)   Wt 159 lb 12.8 oz (72.5 kg)   LMP  (LMP Unknown)   SpO2 100%   BMI 25.79 kg/m    Physical Exam   CHEST: lungs clear to auscultation CARDIOVASCULAR: normal heart  sounds ABDOMEN: tenderness upon palpation in the upper abdomen      Physical Exam Constitutional:      General: She is not in acute distress.    Appearance: Normal appearance. She is not ill-appearing or toxic-appearing.  HENT:     Head: Normocephalic and atraumatic.  Nose: Nose normal. No congestion.  Eyes:     General: No scleral icterus.    Extraocular Movements: Extraocular movements intact.  Cardiovascular:     Rate and Rhythm: Normal rate and regular rhythm.     Pulses: Normal pulses.     Heart sounds: Normal heart sounds.  Pulmonary:     Effort: Pulmonary effort is normal. No respiratory distress.     Breath sounds: Normal breath sounds.  Abdominal:     General: Abdomen is flat. Bowel sounds are normal.     Palpations: Abdomen is soft.     Tenderness: There is abdominal tenderness (mild) in the epigastric area. There is no guarding or rebound.  Musculoskeletal:        General: Normal range of motion.  Lymphadenopathy:     Cervical: No cervical adenopathy.  Skin:    General: Skin is warm and dry.     Findings: No rash.  Neurological:     General: No focal deficit present.     Mental Status: She is alert and oriented to person, place, and time. Mental status is at baseline.  Psychiatric:        Mood and Affect: Mood normal.        Behavior: Behavior normal.        Thought Content: Thought content normal.        Judgment: Judgment normal.     MM 3D DIAGNOSTIC MAMMOGRAM BILATERAL BREAST Result Date: 09/01/2023 CLINICAL DATA:  Are 53 year old female presenting for short-term follow-up of probably benign bilateral breast calcifications. EXAM: DIGITAL DIAGNOSTIC BILATERAL MAMMOGRAM WITH TOMOSYNTHESIS AND CAD TECHNIQUE: Bilateral digital diagnostic mammography and breast tomosynthesis was performed. The images were evaluated with computer-aided detection. COMPARISON:  Previous exam(s). ACR Breast Density Category c: The breasts are heterogeneously dense, which may  obscure small masses. FINDINGS: Right breast: Spot 2D magnification views of the right breast were performed in addition to standard views. The faint calcifications in the upper inner right breast are unchanged. No new suspicious linear or branching forms. No new suspicious findings elsewhere in the right breast. Left breast: Spot 2D magnification views of the left breast were performed in addition to standard views. The tiny group of calcifications in the posterior outer left breast are unchanged. No new suspicious findings elsewhere in the left breast. IMPRESSION: Stable probably benign bilateral breast calcifications. RECOMMENDATION: Diagnostic bilateral mammogram in 6 months. I have discussed the findings and recommendations with the patient. If applicable, a reminder letter will be sent to the patient regarding the next appointment. BI-RADS CATEGORY  3: Probably benign. Electronically Signed   By: Inocente Ast M.D.   On: 09/01/2023 12:04    Recent Results (from the past 2160 hours)  POC COVID-19 BinaxNow     Status: None   Collection Time: 09/07/23  4:25 PM  Result Value Ref Range   SARS Coronavirus 2 Ag Negative Negative  POCT Influenza A/B     Status: Abnormal   Collection Time: 09/07/23  4:25 PM  Result Value Ref Range   Influenza A, POC Positive (A) Negative   Influenza B, POC Negative Negative        Beverley Adine Hummer, MD, MS

## 2023-10-01 ENCOUNTER — Other Ambulatory Visit: Payer: Self-pay | Admitting: Internal Medicine

## 2023-10-01 DIAGNOSIS — E039 Hypothyroidism, unspecified: Secondary | ICD-10-CM

## 2023-11-28 ENCOUNTER — Other Ambulatory Visit: Payer: Self-pay

## 2023-11-28 ENCOUNTER — Emergency Department (HOSPITAL_COMMUNITY)
Admission: EM | Admit: 2023-11-28 | Discharge: 2023-11-28 | Disposition: A | Discharge status: Home / Self Care | Attending: Emergency Medicine | Admitting: Emergency Medicine

## 2023-11-28 ENCOUNTER — Emergency Department (HOSPITAL_COMMUNITY)

## 2023-11-28 ENCOUNTER — Encounter (HOSPITAL_COMMUNITY): Payer: Self-pay | Admitting: Emergency Medicine

## 2023-11-28 DIAGNOSIS — R0789 Other chest pain: Secondary | ICD-10-CM | POA: Diagnosis present

## 2023-11-28 DIAGNOSIS — E876 Hypokalemia: Secondary | ICD-10-CM | POA: Insufficient documentation

## 2023-11-28 DIAGNOSIS — Z87891 Personal history of nicotine dependence: Secondary | ICD-10-CM | POA: Insufficient documentation

## 2023-11-28 DIAGNOSIS — E039 Hypothyroidism, unspecified: Secondary | ICD-10-CM | POA: Insufficient documentation

## 2023-11-28 LAB — TROPONIN I (HIGH SENSITIVITY)
Troponin I (High Sensitivity): 8 ng/L (ref <18)
Troponin I (High Sensitivity): 8 ng/L (ref <18)

## 2023-11-28 LAB — BASIC METABOLIC PANEL WITH GFR
Anion gap: 8 (ref 5–15)
BUN: 11 mg/dL (ref 6–20)
CO2: 23 mmol/L (ref 22–32)
Calcium: 8.3 mg/dL — ABNORMAL LOW (ref 8.9–10.3)
Chloride: 107 mmol/L (ref 98–111)
Creatinine, Ser: 0.71 mg/dL (ref 0.44–1.00)
GFR, Estimated: >60 mL/min (ref >60)
Glucose, Bld: 88 mg/dL (ref 70–99)
Potassium: 3.3 mmol/L — ABNORMAL LOW (ref 3.5–5.1)
Sodium: 138 mmol/L (ref 135–145)

## 2023-11-28 LAB — CBC
HCT: 40.1 % (ref 36.0–46.0)
Hemoglobin: 13 g/dL (ref 12.0–15.0)
MCH: 27 pg (ref 26.0–34.0)
MCHC: 32.4 g/dL (ref 30.0–36.0)
MCV: 83.4 fL (ref 80.0–100.0)
Platelets: 289 10*3/uL (ref 150–400)
RBC: 4.81 MIL/uL (ref 3.87–5.11)
RDW: 14.2 % (ref 11.5–15.5)
WBC: 7.8 10*3/uL (ref 4.0–10.5)
nRBC: 0 % (ref 0.0–0.2)

## 2023-11-28 LAB — D-DIMER, QUANTITATIVE: D-Dimer, Quant: 0.34 ug{FEU}/mL (ref 0.00–0.50)

## 2023-11-28 MED ORDER — KETOROLAC TROMETHAMINE 30 MG/ML IJ SOLN
15.0000 mg | Freq: Once | INTRAMUSCULAR | Status: AC
  Administered 2023-11-28: 15 mg via INTRAVENOUS
  Filled 2023-11-28: qty 1

## 2023-11-28 MED ORDER — LIDOCAINE 5 % EX PTCH
3.0000 | MEDICATED_PATCH | CUTANEOUS | Status: DC
  Administered 2023-11-28: 3 via TRANSDERMAL
  Filled 2023-11-28: qty 3

## 2023-11-28 MED ORDER — METHOCARBAMOL 500 MG PO TABS: 500.0000 mg | ORAL_TABLET | Freq: Two times a day (BID) | ORAL | 0 refills | Status: AC

## 2023-11-28 MED ORDER — NAPROXEN 500 MG PO TABS: 500.0000 mg | ORAL_TABLET | Freq: Two times a day (BID) | ORAL | 0 refills | Status: DC

## 2023-11-28 NOTE — Discharge Instructions (Addendum)
 Thank you for let us  evaluate you today.  Your ED workup was negative for heart injury, pneumonia, blood clot in your lungs.  I suspect that this is related to musculoskeletal/muscle strain.  We provided you with a dose of strong ibuprofen  and your IV here in the emergency department as well as lidocaine  patches.  I have sent muscle relaxer and naproxen  to your pharmacy. You may use naproxen  and Tylenol  intermittently every 8 hours as needed for pain.  Please do not use naproxen  with aspirin , Aleve , ibuprofen , Advil  as they are all in the same family. Robaxin  may cause drowsiness so do not operate heavy machinery including driving or drink alcohol with this.  You may take this at night or split the tablet in half if it makes you too drowsy. You may also use ice/heat or pick up lidocaine  patches found at pharmacy  Return to emergency department if you experience significant worsening symptoms otherwise follow-up with your primary care provider in 7-10 days if symptoms do not improve

## 2023-11-28 NOTE — ED Triage Notes (Signed)
 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  Reports it has got better.  Had 324mg  ASA and fluids.  18G LAC  120/87 HR 90 99%RA

## 2023-11-28 NOTE — ED Provider Notes (Signed)
 [place] EMERGENCY DEPARTMENT AT [facility] Provider Note   CSN: [id number] Arrival date & time: 11/28/23  1603     History  Chief Complaint  Patient presents with   Chest Pain    Nichole Beck is a 53 y.o. 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 Pain was initially 10/10 but has now decreased to 6/10. No diaphoresis nor radiation to back. Was provided 324 mg ASA and IVF PTA. CP worsens with palpation and deep inspiration. She denies having shortness of breath, cough, URI symptoms, recent trauma, hemoptysis, recent surgery/immobilization, pedal edema, history of clots   Chest Pain Associated symptoms: no abdominal pain, no cough, no dizziness, no fatigue, no fever, no headache, no nausea, no numbness, no palpitations, no shortness of breath, no vomiting and no weakness       Home Medications Prior to Admission medications   Medication Sig Start Date End Date Taking? Authorizing Provider  methocarbamol  (ROBAXIN ) 500 MG tablet Take 1 tablet (500 mg total) by mouth 2 (two) times daily. 11/28/23  Yes Royann Cords, PA  naproxen  (NAPROSYN ) 500 MG tablet Take 1 tablet (500 mg total) by mouth 2 (two) times daily. 11/28/23  Yes Royann Cords, PA  betamethasone  valerate ointment (VALISONE ) 0.1 % APPLY  OINTMENT TO AFFECTED AREA TWICE DAILY AS NEEDED 04/17/23   Zilphia Hilt, Estela Y, MD  ipratropium (ATROVENT ) 0.03 % nasal spray Place 2 sprays into both nostrils 3 (three) times daily. 06/13/23   Kandice Orleans, MD  levothyroxine  (SYNTHROID ) 125 MCG tablet Take 1 tablet by mouth once daily 10/02/23   Zilphia Hilt, Charyl Coppersmith, MD  tacrolimus  (PROTOPIC ) 0.1 % ointment Apply topically to areas of rough eczema 2 times a day. 07/24/23   Kandice Orleans, MD  tiZANidine  (ZANAFLEX ) 4 MG  tablet Take 1 tablet (4 mg total) by mouth every 6 (six) hours as needed for muscle spasms. Patient not taking: Reported on 09/07/2023 11/18/22   Spence Dux, PA-C  triamcinolone  ointment (KENALOG ) 0.1 % Apply twice daily for flare ups below neck, maximum 10 days. 06/13/23   Kandice Orleans, MD  Vitamin D , Ergocalciferol , (DRISDOL ) 1.25 MG (50000 UNIT) CAPS capsule Take 50,000 Units by mouth once a week. Patient not taking: Reported on 09/07/2023 04/08/23   [provider]      Allergies    Patient has no known allergies.    Review of Systems   Review of Systems  Constitutional:  Negative for chills, fatigue and fever.  Respiratory:  Negative for cough, chest tightness, shortness of breath and wheezing.   Cardiovascular:  Positive for chest pain. Negative for palpitations.  Gastrointestinal:  Negative for abdominal pain, constipation, diarrhea, nausea and vomiting.  Neurological:  Negative for dizziness, seizures, weakness, light-headedness, numbness and headaches.    Physical Exam Updated Vital Signs BP 133/84   Pulse 68   Temp 98.4 F (36.9 C)   Resp 14   LMP  (LMP Unknown)   SpO2 100%  Physical Exam Vitals and nursing note reviewed.  Constitutional:      General: She is not in acute distress.    Appearance: Normal appearance. She is not ill-appearing.  HENT:     Head: Normocephalic and atraumatic.  Eyes:     Conjunctiva/sclera: Conjunctivae normal.  Cardiovascular:     Rate and  Rhythm: Normal rate.     Heart sounds: Normal heart sounds.  Pulmonary:     Effort: Pulmonary effort is normal. No respiratory distress.     Breath sounds: Normal breath sounds.  Chest:     Chest wall: Tenderness present.  Musculoskeletal:     Right lower leg: No edema.     Left lower leg: No edema.     Comments: Celine Collard' sign negative x 2  Skin:    General: Skin is warm.     Capillary Refill: Capillary refill takes less than 2 seconds.     Coloration: Skin is not jaundiced or pale.   Neurological:     Mental Status: She is alert and oriented to person, place, and time. Mental status is at baseline.     ED Results / Procedures / Treatments   Labs (all labs ordered are listed, but only abnormal results are displayed) Labs Reviewed  BASIC METABOLIC PANEL WITH GFR - Abnormal; Notable for the following components:      Result Value   Potassium 3.3 (*)    Calcium 8.3 (*)    All other components within normal limits  CBC  D-DIMER, QUANTITATIVE  TROPONIN I (HIGH SENSITIVITY)  TROPONIN I (HIGH SENSITIVITY)    EKG None  Radiology DG Chest 2 View Result Date: 11/28/2023 CLINICAL DATA:  Chest pain. Central chest pain radiating to the left arm. EXAM: CHEST - 2 VIEW COMPARISON:  11/17/2022 FINDINGS: Normal heart size and pulmonary vascularity. No focal airspace disease or consolidation in the lungs. Vascular crowding in the lung bases. No blunting of costophrenic angles. No pneumothorax. Mediastinal contours appear intact. IMPRESSION: No active cardiopulmonary disease. Electronically Signed   By: Boyce Byes M.D.   On: 11/28/2023 17:08    Procedures Procedures    Medications Ordered in ED Medications  ketorolac  (TORADOL ) 30 MG/ML injection 15 mg (15 mg Intravenous Given 11/28/23 2047)    ED Course/ Medical Decision Making/ A&P                                 Medical Decision Making Amount and/or Complexity of Data Reviewed Labs: ordered. Radiology: ordered.  Risk Prescription drug management.   Patient presents to the ED for concern of CP, this involves an extensive number of treatment options, and is a complaint that carries with it a high risk of complications and morbidity.  The differential diagnosis includes ACS, dissection, PE, pneumonia, fluid overload, GB pathology.  Not exhaustive list   Co morbidities that complicate the patient evaluation  See HPI   Additional history obtained:  Additional history obtained from  Nursing   External  records from outside source obtained and reviewed including triage RN note   Lab Tests:  I Ordered, and personally interpreted labs.  The pertinent results include:   Potassium 3.3 Calcium 8.3 Troponin negative x 2   Imaging Studies ordered:  I ordered imaging studies including chest x-ray I independently visualized and interpreted imaging which showed no acute cardiopulmonary pathology I agree with the radiologist interpretation   Cardiac Monitoring:  The patient was maintained on a cardiac monitor.  I personally viewed and interpreted the cardiac monitored which showed an underlying rhythm of: NSR   Medicines ordered and prescription drug management:  I ordered medication including toradol   for pain  Reevaluation of the patient after these medicines showed that the patient improved I have reviewed the patients home medicines and  have made adjustments as needed    Problem List / ED Course:  CP Reproducible. May be related to MSK in setting of negative trops and normal EKG Dimer neg CXR unremarkable VS hemodynamically stable with no tachycardia nor hypoxia Provided toradol  and lidocaine  patches in ED significantly improving pain Provided work note for patient to rest possible muscle strain as well as Robaxin , naproxen .  Extensively discussed precautions with these medications and provided precautions on discharge instructions   Reevaluation:  After the interventions noted above, I reevaluated the patient and found that they have :improved     Dispostion:  After consideration of the diagnostic results and the patients response to treatment, I feel that the patent would benefit from outpatient management with PCP follow-up.   Discussed ED workup, disposition, return to ED precautions with patient who expresses understanding agrees with plan.  All questions answered to their satisfaction.  They are agreeable to plan.  Discharge instructions provided on  paperwork Final Clinical Impression(s) / ED Diagnoses Final diagnoses:  Atypical chest pain    Rx / DC Orders ED Discharge Orders          Ordered    naproxen  (NAPROSYN ) 500 MG tablet  2 times daily        11/28/23 2144    methocarbamol  (ROBAXIN ) 500 MG tablet  2 times daily        11/28/23 2144              Royann Cords, PA 11/29/23 1319    Rafael Bun A, DO 12/04/23 1146

## 2023-11-28 NOTE — ED Provider Triage Note (Signed)
 Emergency Medicine Provider Triage Evaluation Note  Nichole Beck , a 53 y.o. female  was evaluated in triage.  Pt complains of chest pain.  Pt reports pain in her arm. No fever no cough  Review of Systems  Positive: Pain in left arm Negative: cough  Physical Exam  BP (!) 127/94 (BP Location: Right Arm)   Pulse 86   Temp 98.4 F (36.9 C) (Oral)   Resp 17   LMP  (LMP Unknown)   SpO2 100%  Gen:   Awake, no distress   Resp:  Normal effort  MSK:   Moves extremities without difficulty  Other:    Medical Decision Making  Medically screening exam initiated at 5:40 PM.  Appropriate orders placed.  Ashok Blake was informed that the remainder of the evaluation will be completed by another provider, this initial triage assessment does not replace that evaluation, and the importance of remaining in the ED until their evaluation is complete.     Sandi Crosby, PA-C 11/28/23 [phone]

## 2023-12-19 ENCOUNTER — Ambulatory Visit (INDEPENDENT_AMBULATORY_CARE_PROVIDER_SITE_OTHER): Payer: Commercial Managed Care - PPO | Admitting: Allergy & Immunology

## 2023-12-19 ENCOUNTER — Other Ambulatory Visit: Payer: Self-pay

## 2023-12-19 ENCOUNTER — Encounter: Payer: Self-pay | Admitting: Allergy & Immunology

## 2023-12-19 VITALS — BP 132/80 | HR 80 | Temp 98.4°F | Resp 19 | Ht 65.75 in | Wt 160.7 lb

## 2023-12-19 DIAGNOSIS — R09A2 Foreign body sensation, throat: Secondary | ICD-10-CM

## 2023-12-19 DIAGNOSIS — J3089 Other allergic rhinitis: Secondary | ICD-10-CM

## 2023-12-19 DIAGNOSIS — L309 Dermatitis, unspecified: Secondary | ICD-10-CM

## 2023-12-19 DIAGNOSIS — J302 Other seasonal allergic rhinitis: Secondary | ICD-10-CM

## 2023-12-19 DIAGNOSIS — R0981 Nasal congestion: Secondary | ICD-10-CM | POA: Diagnosis not present

## 2023-12-19 DIAGNOSIS — L732 Hidradenitis suppurativa: Secondary | ICD-10-CM

## 2023-12-19 MED ORDER — TRIAMCINOLONE ACETONIDE 0.1 % EX OINT
1.0000 | TOPICAL_OINTMENT | Freq: Two times a day (BID) | CUTANEOUS | 1 refills | Status: AC
Start: 2023-12-19 — End: ?

## 2023-12-19 MED ORDER — DOXYCYCLINE HYCLATE 100 MG PO TABS: 100.0000 mg | ORAL_TABLET | Freq: Two times a day (BID) | ORAL | 0 refills | Status: AC

## 2023-12-19 NOTE — Patient Instructions (Addendum)
 1. Seasonal and perennial allergic rhinitis (grasses, ragweed, weeds, trees, dust mites, cat, dog, cockroach, and tobacco) - Continue taking: Atrovent  (ipratropium) 0.03% one spray per nostril 2-3 times daily as needed (CAN BE OVER DRYING) - We are going to order a sinus CT to see if we are missing anything. - We will have to jump through some hoops to get this scheduled.  - Consider allergy shots for better control. - Cigarette smoke will also make this condition worse.   2. Rash under arms - This sounds like suppurativa hidradenitis. - This a chronic inflammatory condition that can lead to boils under the arms, in the groin, etc. - We are going to start an antibiotics (doxycycline 100 mg twice daily). - We are also going to refer you to see Dermatology. - Add on triamcinolone  twice daily for 1-2 weeks for the rash on the back.    3. Return in about 6 months (around 06/20/2024).    Please inform us  of any Emergency Department visits, hospitalizations, or changes in symptoms. Call us  before going to the ED for breathing or allergy symptoms since we might be able to fit you in for a sick visit. Feel free to contact us  anytime with any questions, problems, or concerns.  It was a pleasure to see you again today!  Websites that have reliable patient information: 1. American Academy of Asthma, Allergy, and Immunology: www.aaaai.org 2. Food Allergy Research and Education (FARE): foodallergy.org 3. Mothers of Asthmatics: http://www.asthmacommunitynetwork.org 4. American College of Allergy, Asthma, and Immunology: www.acaai.org   COVID-19 Vaccine Information can be found at: [email or web address] For questions related to vaccine distribution or appointments, please email [email or web address] or call [phone].   We realize that you might be concerned about having an allergic reaction to the COVID19 vaccines. To help with that concern,  WE ARE OFFERING THE COVID19 VACCINES IN OUR OFFICE! Ask the front desk for dates!     "Like" us  on Facebook and Instagram for our latest updates!      A healthy democracy works best when [employer] participate! Make sure you are registered to vote! If you have moved or changed any of your contact information, you will need to get this updated before voting!  In some cases, you MAY be able to register to vote online: [email or web address]/[email or web address]-[email or web address]       t
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
## 2023-12-28 ENCOUNTER — Ambulatory Visit
Admission: RE | Admit: 2023-12-28 | Discharge: 2023-12-28 | Disposition: A | Discharge status: Home / Self Care | Source: Ambulatory Visit | Attending: Allergy & Immunology | Admitting: Allergy & Immunology

## 2023-12-28 DIAGNOSIS — R0981 Nasal congestion: Secondary | ICD-10-CM

## 2023-12-28 DIAGNOSIS — R09A2 Foreign body sensation, throat: Secondary | ICD-10-CM

## 2024-01-02 ENCOUNTER — Ambulatory Visit: Payer: Self-pay | Admitting: Allergy & Immunology

## 2024-01-02 ENCOUNTER — Telehealth: Payer: Self-pay | Admitting: Allergy & Immunology

## 2024-01-02 DIAGNOSIS — J341 Cyst and mucocele of nose and nasal sinus: Secondary | ICD-10-CM

## 2024-01-02 NOTE — Telephone Encounter (Signed)
 LVM for patient to return call.

## 2024-01-02 NOTE — Telephone Encounter (Signed)
 Pt is requesting a call back, she would like detailed info on her results you can lvm.

## 2024-01-03 ENCOUNTER — Telehealth: Payer: Self-pay

## 2024-01-03 NOTE — Telephone Encounter (Signed)
 Spoke with patient about results informed her of the ENT referral verbalized understanding. Patient wanted to know how server these issue are for her.  Please advise.

## 2024-01-04 NOTE — Telephone Encounter (Signed)
 I can't advise. I am not an ENT. Hence the reason that I referred her to see ENT.   Drexel Gentles, MD [facility] 

## 2024-01-05 NOTE — Telephone Encounter (Signed)
 Called patient - DOB/NEED DPR - LMOVM to either check myChart or contact office regarding provider advice.

## 2024-01-12 NOTE — Telephone Encounter (Signed)
 Patient reviewed mychart message on 01/02/24. I called to follow up and see if she had an further questions or concerns.

## 2024-01-14 ENCOUNTER — Other Ambulatory Visit: Payer: Self-pay | Admitting: Internal Medicine

## 2024-01-14 DIAGNOSIS — E039 Hypothyroidism, unspecified: Secondary | ICD-10-CM

## 2024-01-17 ENCOUNTER — Encounter (INDEPENDENT_AMBULATORY_CARE_PROVIDER_SITE_OTHER): Payer: Self-pay

## 2024-02-01 ENCOUNTER — Encounter (INDEPENDENT_AMBULATORY_CARE_PROVIDER_SITE_OTHER): Payer: Self-pay

## 2024-03-08 ENCOUNTER — Ambulatory Visit
Admission: RE | Admit: 2024-03-08 | Discharge: 2024-03-08 | Disposition: A | Discharge status: Home / Self Care | Payer: Commercial Managed Care - PPO | Source: Ambulatory Visit | Attending: Internal Medicine | Admitting: Internal Medicine

## 2024-03-08 DIAGNOSIS — R921 Mammographic calcification found on diagnostic imaging of breast: Secondary | ICD-10-CM

## 2024-03-25 ENCOUNTER — Ambulatory Visit: Admitting: Physician Assistant

## 2024-04-02 ENCOUNTER — Ambulatory Visit (INDEPENDENT_AMBULATORY_CARE_PROVIDER_SITE_OTHER): Admitting: Otolaryngology

## 2024-04-02 ENCOUNTER — Encounter (INDEPENDENT_AMBULATORY_CARE_PROVIDER_SITE_OTHER): Payer: Self-pay | Admitting: Otolaryngology

## 2024-04-02 VITALS — BP 134/92 | HR 84

## 2024-04-02 DIAGNOSIS — J310 Chronic rhinitis: Secondary | ICD-10-CM | POA: Diagnosis not present

## 2024-04-02 DIAGNOSIS — J343 Hypertrophy of nasal turbinates: Secondary | ICD-10-CM

## 2024-04-02 DIAGNOSIS — J342 Deviated nasal septum: Secondary | ICD-10-CM | POA: Diagnosis not present

## 2024-04-02 DIAGNOSIS — F17210 Nicotine dependence, cigarettes, uncomplicated: Secondary | ICD-10-CM | POA: Diagnosis not present

## 2024-04-02 DIAGNOSIS — J338 Other polyp of sinus: Secondary | ICD-10-CM | POA: Diagnosis not present

## 2024-04-02 DIAGNOSIS — R0981 Nasal congestion: Secondary | ICD-10-CM

## 2024-04-02 MED ORDER — FLUTICASONE PROPIONATE 50 MCG/ACT NA SUSP: 2.0000 | Freq: Every day | NASAL | 10 refills | Status: AC

## 2024-04-03 ENCOUNTER — Encounter: Payer: Self-pay | Admitting: Physician Assistant

## 2024-04-03 ENCOUNTER — Ambulatory Visit: Admitting: Physician Assistant

## 2024-04-03 VITALS — BP 130/95 | HR 79

## 2024-04-03 DIAGNOSIS — L301 Dyshidrosis [pompholyx]: Secondary | ICD-10-CM | POA: Diagnosis not present

## 2024-04-03 DIAGNOSIS — J343 Hypertrophy of nasal turbinates: Secondary | ICD-10-CM | POA: Insufficient documentation

## 2024-04-03 DIAGNOSIS — L659 Nonscarring hair loss, unspecified: Secondary | ICD-10-CM

## 2024-04-03 DIAGNOSIS — J342 Deviated nasal septum: Secondary | ICD-10-CM | POA: Insufficient documentation

## 2024-04-03 DIAGNOSIS — L732 Hidradenitis suppurativa: Secondary | ICD-10-CM | POA: Diagnosis not present

## 2024-04-03 DIAGNOSIS — J310 Chronic rhinitis: Secondary | ICD-10-CM | POA: Insufficient documentation

## 2024-04-03 DIAGNOSIS — J338 Other polyp of sinus: Secondary | ICD-10-CM | POA: Insufficient documentation

## 2024-04-03 MED ORDER — CLOBETASOL PROPIONATE 0.05 % EX CREA: 1.0000 | TOPICAL_CREAM | Freq: Two times a day (BID) | CUTANEOUS | 1 refills | Status: AC

## 2024-04-03 NOTE — Progress Notes (Signed)
 CC: Chronic nasal congestion, left maxillary cyst  HPI:  Nichole Beck is a 53 y.o. female who presents today complaining of chronic nasal congestion for the past 2 years.  She has a history of environmental allergies.  She uses multiple allergy medications and steroid nasal sprays on a as needed basis.  She denies any recent known sinusitis.  She has no previous ENT surgery.  She recently underwent a sinus CT scan.  The CT showed nasal septal deviation and bilateral inferior turbinate hypertrophy.  A 15 mm mucous retention cyst was also noted within the left maxillary sinus.  Currently she denies any facial pain, fever, or visual change.  Past Medical History:  Diagnosis Date   Abnormal Pap smear of cervix    many yrs ago   Anemia    Hypothyroidism    Nicotine dependence    STD (sexually transmitted disease)    gonorrhea treated   Urticaria     Past Surgical History:  Procedure Laterality Date   BREAST BIOPSY     COLPOSCOPY     TUBAL LIGATION      Family History  Problem Relation Age of Onset   Cancer Maternal Grandmother    Diabetes Maternal Grandmother    Cancer Paternal Grandmother    Diabetes Paternal Grandmother    Asthma Daughter    Breast cancer Neg Hx    Colon polyps Neg Hx    Colon cancer Neg Hx    Esophageal cancer Neg Hx    Rectal cancer Neg Hx    Stomach cancer Neg Hx     Social History:  reports that she has been smoking cigarettes. She has a 16.5 pack-year smoking history. She has never been exposed to tobacco smoke. She has never used smokeless tobacco. She reports current alcohol use. She reports that she does not use drugs.  Allergies: No Known Allergies  Prior to Admission medications   Medication Sig Start Date End Date Taking? Authorizing Provider  betamethasone  valerate ointment (VALISONE ) 0.1 % APPLY  OINTMENT TO AFFECTED AREA TWICE DAILY AS NEEDED 04/17/23  Yes Theophilus Andrews, Tully GRADE, MD  fluticasone  (FLONASE ) 50 MCG/ACT nasal spray Place 2  sprays into both nostrils daily. 04/02/24 05/02/24 Yes Karis Clunes, MD  ipratropium (ATROVENT ) 0.03 % nasal spray Place 2 sprays into both nostrils 3 (three) times daily. 06/13/23  Yes Tobie Arleta SQUIBB, MD  levothyroxine  (SYNTHROID ) 125 MCG tablet Take 1 tablet by mouth once daily 01/15/24  Yes Theophilus Andrews, Tully GRADE, MD  tacrolimus  (PROTOPIC ) 0.1 % ointment Apply topically to areas of rough eczema 2 times a day. 07/24/23  Yes Tobie Arleta SQUIBB, MD  triamcinolone  ointment (KENALOG ) 0.1 % Apply 1 Application topically 2 (two) times daily. Use for one to two weeks max on the affected areas on your upper back and arms. 12/19/23  Yes Iva Marty Saltness, MD  methocarbamol  (ROBAXIN ) 500 MG tablet Take 1 tablet (500 mg total) by mouth 2 (two) times daily. Patient not taking: Reported on 04/02/2024 11/28/23   Minnie Tinnie BRAVO, PA  naproxen  (NAPROSYN ) 500 MG tablet Take 1 tablet (500 mg total) by mouth 2 (two) times daily. Patient not taking: Reported on 04/02/2024 11/28/23   Minnie Tinnie BRAVO, PA  tiZANidine  (ZANAFLEX ) 4 MG tablet Take 1 tablet (4 mg total) by mouth every 6 (six) hours as needed for muscle spasms. Patient not taking: Reported on 04/02/2024 11/18/22   Bernis Ernst, PA-C  Vitamin D , Ergocalciferol , (DRISDOL ) 1.25 MG (50000 UNIT) CAPS capsule Take  50,000 Units by mouth once a week. Patient not taking: Reported on 04/02/2024 04/08/23   [provider]    Blood pressure (!) 134/92, pulse 84, SpO2 96%. Exam: General: Communicates without difficulty, well nourished, no acute distress. Head: Normocephalic, no evidence injury, no tenderness, facial buttresses intact without stepoff. Face/sinus: No tenderness to palpation and percussion. Facial movement is normal and symmetric. Eyes: PERRL, EOMI. No scleral icterus, conjunctivae clear. Neuro: CN II exam reveals vision grossly intact.  No nystagmus at any point of gaze. Ears: Auricles well formed without lesions.  Ear canals are intact without mass or lesion.  No  erythema or edema is appreciated.  The TMs are intact without fluid. Nose: External evaluation reveals normal support and skin without lesions.  Dorsum is intact.  Anterior rhinoscopy reveals congested mucosa over anterior aspect of inferior turbinates and deviated septum.  No purulence noted. Oral:  Oral cavity and oropharynx are intact, symmetric, without erythema or edema.  Mucosa is moist without lesions. Neck: Full range of motion without pain.  There is no significant lymphadenopathy.  No masses palpable.  Thyroid  bed within normal limits to palpation.  Parotid glands and submandibular glands equal bilaterally without mass.  Trachea is midline. Neuro:  CN 2-12 grossly intact.   Procedure:  Flexible Nasal Endoscopy: Description: Risks, benefits, and alternatives of flexible endoscopy were explained to the patient.  Specific mention was made of the risk of throat numbness with difficulty swallowing, possible bleeding from the nose and mouth, and pain from the procedure.  The patient gave oral consent to proceed.  The flexible scope was inserted into the right nasal cavity.  Endoscopy of the interior nasal cavity, superior, inferior, and middle meatus was performed. The sphenoid-ethmoid recess was examined. Edematous mucosa was noted.  No polyp, mass, or lesion was appreciated. Nasal septal deviation noted. Olfactory cleft was clear.  Nasopharynx was clear.  Turbinates were hypertrophied but without mass.  The procedure was repeated on the contralateral side with similar findings.  The patient tolerated the procedure well.   Assessment: 1.  Chronic rhinitis with nasal mucosal congestion, nasal septal deviation, and bilateral inferior turbinate hypertrophy.  More than 95% of her nasal passageways are obstructed bilaterally. 2.  The patient also has a 15 mm mucous retention cyst within the floor of the left maxillary sinus.  It is asymptomatic at this time.  Plan: 1.  The physical exam and nasal endoscopy  findings are reviewed with the patient. 2.  The CT images are also reviewed. 3.  Flonase  nasal spray 2 sprays each nostril daily.  The importance of consistent daily use is discussed. 4.  The patient will return for reevaluation in 6 weeks.  If she continues to be symptomatic, she may be a candidate for surgical intervention with septoplasty and bilateral turbinate reduction.  Ilian Wessell W Haris Baack 04/03/2024, 9:06 AM

## 2024-04-03 NOTE — Patient Instructions (Signed)

## 2024-04-03 NOTE — Progress Notes (Signed)
   New Patient Visit   Subjective  Briannia Laba is a 53 y.o. female NEW PATIENT who presents for the following: Rash  Patient states she has a rash located at the hands and feet that she would like to have examined. Patient reports the areas have been there for 1 years. She reports the areas are bothersome.Patient rates irritation 8 out of 10. Patient reports she has previously been treated for these areas she was prescribed betamethasone  ointment 0.1%. Patient denies Hx of bx. Patient denies family history of skin cancer(s). Patient reports she washes with dove sensitive and has stop using Deoderant.   Other concern: hair loss. Has known thyroid  issues.   The patient has spots, moles and lesions to be evaluated, some may be new or changing and the patient may have concern these could be cancer.   The following portions of the chart were reviewed this encounter and updated as appropriate: medications, allergies, medical history  Review of Systems:  No other skin or systemic complaints except as noted in HPI or Assessment and Plan.  Objective  Well appearing patient in no apparent distress; mood and affect are within normal limits.   A focused examination was performed of the following areas: scalp, face, axillae, feet and hands.   Relevant exam findings are noted in the Assessment and Plan.    Assessment & Plan   HIDRADENITIS SUPPURATIVA - minimal  Exam:  open comedones (only)     Hidradenitis Suppurativa is a chronic; persistent; non-curable, but treatable condition due to abnormal inflamed sweat glands in the body folds (axilla, inframammary, groin, medial thighs), causing recurrent painful draining cysts and scarring. It can be associated with severe scarring acne and cysts; also abscesses and scarring of scalp. The goal is control and prevention of flares, as it is not curable. Scars are permanent and can be thickened. Treatment may include daily use of topical medication and  oral antibiotics.  Oral isotretinoin may also be helpful.  For some cases, Humira or Cosentyx (biologic injections) may be prescribed to decrease the inflammatory process and prevent flares.  When indicated, inflamed cysts may also be treated surgically.  Treatment Plan: recommend alternating Dial, Hibiclens and benzoyl peroxide.  Smoking cessation is also recommended.   HAND DERMATITIS -- DYSHIDROTIC ECZEMA Exam: small vesicles hands and feet.     Hand Dermatitis is a chronic type of eczema that can come and go on the hands and fingers.  While there is no cure, the rash and symptoms can be managed with topical prescription medications, and for more severe cases, with systemic medications.  Recommend mild soap and routine use of moisturizing cream after handwashing.  Minimize soap/water exposure when possible.    Treatment Plan Clobetasol  as directed.   HAIR LOSS  - Discussed likely telogen effluvium and/or related to her thyroid   - Can start Minoxidil OTC, if desires.     NONSCARRING HAIR LOSS   DYSHIDROTIC ECZEMA   HIDRADENITIS SUPPURATIVA    No follow-ups on file.  I, Doyce Pan, CMA, am acting as scribe for Tamee Battin K, PA-C.   Documentation: I have reviewed the above documentation for accuracy and completeness, and I agree with the above.  Joleene Burnham K, PA-C

## 2024-05-20 ENCOUNTER — Encounter (INDEPENDENT_AMBULATORY_CARE_PROVIDER_SITE_OTHER): Payer: Self-pay | Admitting: Otolaryngology

## 2024-05-20 ENCOUNTER — Ambulatory Visit (INDEPENDENT_AMBULATORY_CARE_PROVIDER_SITE_OTHER): Admitting: Otolaryngology

## 2024-05-20 VITALS — BP 126/87 | HR 76 | Temp 98.6°F | Ht 67.0 in | Wt 160.0 lb

## 2024-05-20 DIAGNOSIS — R0981 Nasal congestion: Secondary | ICD-10-CM

## 2024-05-20 DIAGNOSIS — J343 Hypertrophy of nasal turbinates: Secondary | ICD-10-CM

## 2024-05-20 DIAGNOSIS — J310 Chronic rhinitis: Secondary | ICD-10-CM

## 2024-05-20 DIAGNOSIS — J3489 Other specified disorders of nose and nasal sinuses: Secondary | ICD-10-CM

## 2024-05-20 DIAGNOSIS — J338 Other polyp of sinus: Secondary | ICD-10-CM

## 2024-05-20 DIAGNOSIS — J342 Deviated nasal septum: Secondary | ICD-10-CM | POA: Diagnosis not present

## 2024-05-21 NOTE — Progress Notes (Signed)
 Patient ID: Nichole Beck, female   DOB: [birth year], 53 y.o.   MRN: [id number]  Follow-up: Chronic nasal obstruction, left maxillary cyst/polyp  HPI: The patient is a 53 year old female who returns today for her follow-up evaluation.  The patient was last seen in September 2025.  At that time, she was complaining of chronic nasal obstruction for the past 2 years.  She was treated with multiple allergy medications and steroid nasal sprays.  Her CT scan showed a 1.5 cm left maxillary cyst/polyp.  The patient was treated with Flonase  nasal spray and nasal saline irrigation.  The patient returns today complaining of persistent nasal obstruction and facial pressure.  She has not responded to medical treatment.  She is interested in more definitive therapy.  Exam: General: Communicates without difficulty, well nourished, no acute distress. Head: Normocephalic, no evidence injury, no tenderness, facial buttresses intact without stepoff. Face/sinus: No tenderness to palpation and percussion. Facial movement is normal and symmetric. Eyes: PERRL, EOMI. No scleral icterus, conjunctivae clear. Neuro: CN II exam reveals vision grossly intact.  No nystagmus at any point of gaze. Ears: Auricles well formed without lesions.  Ear canals are intact without mass or lesion.  No erythema or edema is appreciated.  The TMs are intact without fluid. Nose: External evaluation reveals normal support and skin without lesions.  Dorsum is intact.  Anterior rhinoscopy reveals congested mucosa over anterior aspect of inferior turbinates and deviated septum.  No purulence noted. Oral:  Oral cavity and oropharynx are intact, symmetric, without erythema or edema.  Mucosa is moist without lesions. Neck: Full range of motion without pain.  There is no significant lymphadenopathy.  No masses palpable.  Thyroid  bed within normal limits to palpation.  Parotid glands and submandibular glands equal bilaterally without mass.  Trachea is midline. Neuro:   CN 2-12 grossly intact.   Assessment: 1.  Chronic rhinitis with nasal mucosal congestion, nasal septal deviation, and bilateral inferior turbinate hypertrophy.  More than 95% of her nasal passageways are obstructed bilaterally.  She has not responded to medical treatment for the past 2 years. 2.  The patient also has a 1.5 cm left maxillary cyst/polyp.  Plan: 1.  The physical exam findings are reviewed with the patient. 2.  Continue with Flonase  nasal spray and nasal saline irrigation daily. 3.  In light of her persistent symptoms, she may benefit from surgical intervention with septoplasty, bilateral turbinate reduction, and left maxillary antrostomy with polyp removal.  The risk, benefits, alternatives, and details of the procedures are extensively discussed with the patient.  Questions were invited and answered. 4.  The patient would like to proceed with the procedures.

## 2024-06-06 ENCOUNTER — Ambulatory Visit: Admitting: Allergy & Immunology

## 2024-06-25 ENCOUNTER — Ambulatory Visit: Admitting: Allergy & Immunology

## 2024-07-23 ENCOUNTER — Other Ambulatory Visit: Payer: Self-pay

## 2024-07-23 ENCOUNTER — Ambulatory Visit: Admitting: Allergy & Immunology

## 2024-07-23 ENCOUNTER — Encounter: Payer: Self-pay | Admitting: Allergy & Immunology

## 2024-07-23 VITALS — BP 128/80 | HR 80 | Temp 98.1°F | Resp 18 | Ht 67.0 in | Wt 168.3 lb

## 2024-07-23 DIAGNOSIS — J302 Other seasonal allergic rhinitis: Secondary | ICD-10-CM

## 2024-07-23 DIAGNOSIS — J3089 Other allergic rhinitis: Secondary | ICD-10-CM | POA: Diagnosis not present

## 2024-07-23 DIAGNOSIS — R09A2 Foreign body sensation, throat: Secondary | ICD-10-CM

## 2024-07-23 DIAGNOSIS — L309 Dermatitis, unspecified: Secondary | ICD-10-CM

## 2024-07-23 DIAGNOSIS — J341 Cyst and mucocele of nose and nasal sinus: Secondary | ICD-10-CM | POA: Diagnosis not present

## 2024-07-23 NOTE — Progress Notes (Signed)
 "  FOLLOW UP  Date of Service/Encounter:  07/23/2024   Assessment:   Seasonal and perennial allergic rhinitis (grasses, ragweed, weeds, trees, dust mites, cat, dog, cockroach, and tobacco) - interested in starting allergen immunotherapy    Dysphagia - with normal GI and ENT workup   Sinus mucous retention cyst of left maxillary - unclear clinical relevance (this is going to be removed by Dr. Karis)   Rash - better on Opzelura    Hidradenitis suppurativa - sees PA Erminio Like with [facility]  Plan/Recommendations:   1. Seasonal and perennial allergic rhinitis (grasses, ragweed, weeds, trees, dust mites, cat, dog, cockroach, and tobacco) - Continue taking: Atrovent  (ipratropium) 0.03% one spray per nostril 2-3 times daily as needed (CAN BE OVER DRYING) - STRONGLY consider starting allergy shots for long-term control. - Information on this provided today. - Check with your insurance to see what the copays will be and call us  when you make a decision.  - We can even do rush immunotherapy to get you through quicker and get you closer to the maintenance, where it spaces out to every month injection.   2. Rash under arms - Continue to follow with Dermatology.  - Continue with triamcinolone  twice daily as needed.   3. Return in about 24 days (around 08/16/2024) for RUSH IMMUNOTHERAPY IN [place] OR [place]. You can have the follow up appointment with Dr. Iva or a Nurse Practicioner (our Nurse Practitioners are excellent and always have Physician oversight!).    Subjective:   Nichole Beck is a 53 y.o. female presenting today for follow up of  Chief Complaint  Patient presents with   Follow-up    Patient is still dealing with post nasal drip.     Nichole Beck has a history of the following: Patient Active Problem List   Diagnosis Date Noted   Chronic rhinitis 04/03/2024   Deviated nasal septum 04/03/2024   Hypertrophy of nasal turbinates 04/03/2024   Other polyp of  sinus 04/03/2024   Influenza A 09/07/2023   Vitamin D  deficiency 01/05/2023   Bilateral hand pain 05/31/2021   Multiple thyroid  nodules 05/25/2021   Dysphagia 03/25/2021   Hypothyroidism     History obtained from: chart review and patient.  Discussed the use of AI scribe software for clinical note transcription with the patient and/or guardian, who gave verbal consent to proceed.  Nichole Beck is a 53 y.o. female presenting for a follow up visit. She was last seen in May 2025. At that time, we continued with the Atrovent  as the PND was her worst symptom. We did order a sinus CT and discussed allergy shots for long-term control of symptoms.   Since the last visit, she has continued to have a lot of postnasal drip and allergy symptoms.   Allergic Rhinitis Symptom History: She experiences ongoing issues with sneezing, coughing, and excessive mucus production. She describes the environment as 'so dry'. These symptoms have persisted for an unspecified duration despite previous discussions about potential treatments. She is interested in discussing allergy shots more today, although her schedule would make it difficult for her to get the shots. She does have Fridays off, however.   She had a sinus CT on May 29th:   IMPRESSION: 1. Minimal mucosal thickening within the frontal sinuses. On the right, this partially obstructs the frontal sinus drainage pathway. 2. Minimal mucosal thickening/secretions within anterior ethmoid air cells, bilaterally. 3. Small-volume secretions within the right sphenoid sinus. 4. 15 mm left maxillary sinus mucous retention cyst. 5.  Mild leftward bowing of the nasal septum. 6. Minimal mucosal thickening within the bilateral nasal passages.  She is scheduled for surgery on January 12th to address mucous cysts and a deviated septum. The surgery will involve removing the cysts, correcting the septum, and opening up her nasal passages. She anticipates a recovery period of a  few days and has planned to take the entire week off from work.  Skin Symptom History: She has a history of eczema, which she describes as sometimes 'unbearable'. She has previously sought dermatological care but experienced delays in receiving treatment. She recalls being prescribed doxycycline  for sores under her arms, but it did not lead to significant improvement.  Her mother passed away from an aneurysm, but she has not been checked for aneurysms herself. No unexplained headaches.  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      Otherwise, there have been no changes to her past medical history, surgical history, family history, or social history.    Review of systems otherwise negative other than that mentioned in the HPI.    Objective:   Blood pressure 128/80, pulse 80, temperature 98.1 F (36.7 C), temperature source Temporal, resp. rate 18, height 5' 7 (1.702 m), weight 168 lb 4.8 oz (76.3 kg), SpO2 99%. Body mass index is 26.36 kg/m.    Physical Exam Vitals reviewed.  Constitutional:      Appearance: She is well-developed.     Comments: Very pleasant. I get a few smiles out of her.   HENT:     Head: Normocephalic and atraumatic.     Right Ear: Tympanic membrane, ear canal and external ear normal. No drainage, swelling or tenderness. Tympanic membrane is not injected, scarred, erythematous, retracted or bulging.     Left Ear: Tympanic membrane, ear canal and external ear normal. No drainage, swelling or tenderness. Tympanic membrane is not injected, scarred, erythematous, retracted or bulging.     Nose: Mucosal edema and rhinorrhea present. No nasal deformity or septal deviation.     Right Turbinates: Enlarged, swollen and pale.     Left Turbinates: Enlarged, swollen and pale.     Right Sinus: No maxillary sinus tenderness or frontal sinus tenderness.     Left Sinus: No maxillary sinus tenderness or frontal sinus  tenderness.     Comments: No nasal polyps. Clear rhinorrhea.  Turbinates enlarged and erythematous.    Mouth/Throat:     Lips: Pink.     Mouth: Mucous membranes are moist. Mucous membranes are not pale and not dry.     Pharynx: Uvula midline.     Comments: Cobblestoning present in the posterior oropharynx. Tonsils normally sized bilaterally. Eyes:     General: Lids are normal. Allergic shiner present.        Right eye: No discharge.        Left eye: No discharge.     Conjunctiva/sclera: Conjunctivae normal.     Right eye: Right conjunctiva is not injected. No chemosis.    Left eye: Left conjunctiva is not injected. No chemosis.    Pupils: Pupils are equal, round, and reactive to light.  Cardiovascular:     Rate and Rhythm: Normal rate and regular rhythm.     Heart sounds: Normal heart sounds.  Pulmonary:     Effort: Pulmonary effort is normal. No tachypnea, accessory muscle usage or respiratory distress.     Breath sounds: Normal breath sounds. No wheezing, rhonchi or rales.  Comments: Moving air well in all lung fields. No increased work of breathing noted.  Chest:     Chest wall: No tenderness.  Abdominal:     Tenderness: There is no abdominal tenderness. There is no guarding or rebound.  Lymphadenopathy:     Head:     Right side of head: No submandibular, tonsillar or occipital adenopathy.     Left side of head: No submandibular, tonsillar or occipital adenopathy.     Cervical: No cervical adenopathy.  Skin:    General: Skin is warm.     Capillary Refill: Capillary refill takes less than 2 seconds.     Coloration: Skin is not pale.     Findings: No abrasion, erythema, petechiae or rash. Rash is not papular, urticarial or vesicular.     Comments: No eczematous or urticarial lesions noted.  Neurological:     Mental Status: She is alert.  Psychiatric:        Behavior: Behavior is cooperative.      Diagnostic studies: none        Marty Shaggy, MD  [facility]        "

## 2024-07-23 NOTE — Patient Instructions (Addendum)
 1. Seasonal and perennial allergic rhinitis (grasses, ragweed, weeds, trees, dust mites, cat, dog, cockroach, and tobacco) - Continue taking: Atrovent  (ipratropium) 0.03% one spray per nostril 2-3 times daily as needed (CAN BE OVER DRYING) - STRONGLY consider starting allergy shots for long-term control. - Information on this provided today. - Check with your insurance to see what the copays will be and call us  when you make a decision.  - We can even do rush immunotherapy to get you through quicker and get you closer to the maintenance, where it spaces out to every month injection.   2. Rash under arms - Continue to follow with Dermatology.  - Continue with triamcinolone  twice daily as needed.   3. Return in about 24 days (around 08/16/2024) for RUSH IMMUNOTHERAPY IN [place] OR [place]. You can have the follow up appointment with Dr. Iva or a Nurse Practicioner (our Nurse Practitioners are excellent and always have Physician oversight!).    Please inform us  of any Emergency Department visits, hospitalizations, or changes in symptoms. Call us  before going to the ED for breathing or allergy symptoms since we might be able to fit you in for a sick visit. Feel free to contact us  anytime with any questions, problems, or concerns.  It was a pleasure to see you again today!  Websites that have reliable patient information: 1. American Academy of Asthma, Allergy, and Immunology: www.aaaai.org 2. Food Allergy Research and Education (FARE): foodallergy.org 3. Mothers of Asthmatics: http://www.asthmacommunitynetwork.org 4. American College of Allergy, Asthma, and Immunology: www.acaai.org      Like us  on [employer] and Instagram for our latest updates!      A healthy democracy works best when [employer] participate! Make sure you are registered to vote! If you have moved or changed any of your contact information, you will need to get this updated before voting! Scan the QR codes below to  learn more!      Did you know that allergy shots can CURE your environmental allergies?  ?? Helps allergies get better for a long time -- Even after you stop treatment, your body can stay less allergic for years. ?? Trains your immune system to calm down -- It helps your body stop overreacting to things like pollen, dust, or pets. ?? Teaches your body to ignore allergens -- It makes special immune cells (called regulatory cells) that tell the body not to attack harmless things. ?? Makes helpful antibodies -- These new antibodies block the bad ones that cause sneezing, itching, and swelling. ?? Shifts your immune balance -- It helps your body respond in a more normal way instead of with strong allergic reactions. ?? Can stop allergies from getting worse -- It may keep problems like hay fever from turning into asthma. ?? Reduces allergic reactions -- It calms the cells that cause symptoms like runny nose, itchy eyes, or wheezing. ?? Can treat environmental allergies, allergic asthma, and eczema!  They are a long-term commitment - especially when you first start - but they eventually space out to once a month after the first 4-6 months of treatment.   Allergy Shots  Allergies are the result of a chain reaction that starts in the immune system. Your immune system controls how your body defends itself. For instance, if you have an allergy to pollen, your immune system identifies pollen as an invader or allergen. Your immune system overreacts by producing antibodies called Immunoglobulin E (IgE). These antibodies travel to cells that release chemicals, causing an allergic reaction.  The concept behind allergy immunotherapy, whether it is received in the form of shots or tablets, is that the immune system can be desensitized to specific allergens that trigger allergy symptoms. Although it requires time and patience, the payback can be long-term relief. Allergy injections contain a dilute solution  of those substances that you are allergic to based upon your skin testing and allergy history.   How Do Allergy Shots Work?  Allergy shots work much like a vaccine. Your body responds to injected amounts of a particular allergen given in increasing doses, eventually developing a resistance and tolerance to it. Allergy shots can lead to decreased, minimal or no allergy symptoms.  There generally are two phases: build-up and maintenance. Build-up often ranges from three to six months and involves receiving injections with increasing amounts of the allergens. The shots are typically given once or twice a week, though more rapid build-up schedules are sometimes used.  The maintenance phase begins when the most effective dose is reached. This dose is different for each person, depending on how allergic you are and your response to the build-up injections. Once the maintenance dose is reached, there are longer periods between injections, typically two to four weeks.  Occasionally doctors give cortisone-type shots that can temporarily reduce allergy symptoms. These types of shots are different and should not be confused with allergy immunotherapy shots.  Who Can Be Treated with Allergy Shots?  Allergy shots may be a good treatment approach for people with allergic rhinitis (hay fever), allergic asthma, conjunctivitis (eye allergy) or stinging insect allergy.   Before deciding to begin allergy shots, you should consider:   The length of allergy season and the severity of your symptoms  Whether medications and/or changes to your environment can control your symptoms  Your desire to avoid long-term medication use  Time: allergy immunotherapy requires a major time commitment  Cost: may vary depending on your insurance coverage  Allergy shots for children age 21 and older are effective and often well tolerated. They might prevent the onset of new allergen sensitivities or the progression to  asthma.  Allergy shots are not started on patients who are pregnant but can be continued on patients who become pregnant while receiving them. In some patients with other medical conditions or who take certain common medications, allergy shots may be of risk. It is important to mention other medications you talk to your allergist.   What are the two types of build-ups offered:   RUSH or Rapid Desensitization -- one day of injections lasting from 8:30-4:30pm, injections every 1 hour.  Approximately half of the build-up process is completed in that one day.  The following week, normal build-up is resumed, and this entails ~16 visits either weekly or twice weekly, until reaching your maintenance dose which is continued weekly until eventually getting spaced out to every month for a duration of 3 to 5 years. The regular build-up appointments are nurse visits where the injections are administered, followed by required monitoring for 30 minutes.    Traditional build-up -- weekly visits for 6 -12 months until reaching maintenance dose, then continue weekly until eventually spacing out to every 4 weeks as above. At these appointments, the injections are administered, followed by required monitoring for 30 minutes.     Either way is acceptable, and both are equally effective. With the rush protocol, the advantage is that less time is spent here for injections overall AND you would also reach maintenance dosing faster (which is when  the clinical benefit starts to become more apparent). Not everyone is a candidate for rapid desensitization.   IF we proceed with the RUSH protocol, there are premedications which must be taken the day before and the day after the rush only (this includes antihistamines, steroids, and Singulair ).  After the rush day, no prednisone or Singulair  is required, and we just recommend antihistamines taken on your injection day.  What Is An Estimate of the Costs?  If you are  interested in starting allergy injections, please check with your insurance company about your coverage for both allergy vial sets and allergy injections.  Please do so prior to making the appointment to start injections.  The following are CPT codes to give to your insurance company. These are the amounts we BILL to the insurance company, but the amount YOU WILL PAY and WE RECEIVE IS SUBSTANTIALLY LESS and depends on the contracts we have with different insurance companies.   Amount Billed to Insurance Two allergy vial set  CPT 95165   $ 2400     Two injections   CPT 95117   $ 40 RUSH (Rapid Desensitization) CPT 95180 x 8 hours  $500/hour  Regarding the allergy injections, your co-pay may or may not apply with each injection, so please confirm this with your insurance company. When you start allergy injections, 1 or 2 sets of vials are made based on your allergies.  Not all patients can be on one set of vials. A set of vials lasts 6 months to a year depending on how quickly you can proceed with your build-up of your allergy injections. Vials are personalized for each patient depending on their specific allergens.  How often are allergy injection given during the build-up period?   Injections are given at least weekly during the build-up period until your maintenance dose is achieved. Per the doctor's discretion, you may have the option of getting allergy injections two times per week during the build-up period. However, there must be at least 48 hours between injections. The build-up period is usually completed within 6-12 months depending on your ability to schedule injections and for adjustments for reactions. When maintenance dose is reached, your injection schedule is gradually changed to every two weeks and later to every three weeks. Injections will then continue every 4 weeks. Usually, injections are continued for a total of 3-5 years.   When Will I Feel Better?  Some may experience decreased  allergy symptoms during the build-up phase. For others, it may take as long as 12 months on the maintenance dose. If there is no improvement after a year of maintenance, your allergist will discuss other treatment options with you.  If you arent responding to allergy shots, it may be because there is not enough dose of the allergen in your vaccine or there are missing allergens that were not identified during your allergy testing. Other reasons could be that there are high levels of the allergen in your environment or major exposure to non-allergic triggers like tobacco smoke.  What Is the Length of Treatment?  Once the maintenance dose is reached, allergy shots are generally continued for three to five years. The decision to stop should be discussed with your allergist at that time. Some people may experience a permanent reduction of allergy symptoms. Others may relapse and a longer course of allergy shots can be considered.  What Are the Possible Reactions?  The two types of adverse reactions that can occur with allergy shots are local  and systemic. Common local reactions include very mild redness and swelling at the injection site, which can happen immediately or several hours after. Report a delayed reaction from your last injection. These include arm swelling or runny nose, watery eyes or cough that occurs within 12-24 hours after injection. A systemic reaction, which is less common, affects the entire body or a particular body system. They are usually mild and typically respond quickly to medications. Signs include increased allergy symptoms such as sneezing, a stuffy nose or hives.   Rarely, a serious systemic reaction called anaphylaxis can develop. Symptoms include swelling in the throat, wheezing, a feeling of tightness in the chest, nausea or dizziness. Most serious systemic reactions develop within 30 minutes of allergy shots. This is why it is strongly recommended you wait in your doctors  office for 30 minutes after your injections. Your allergist is trained to watch for reactions, and his or her staff is trained and equipped with the proper medications to identify and treat them.   Report to the nurse immediately if you experience any of the following symptoms: swelling, itching or redness of the skin, hives, watery eyes/nose, breathing difficulty, excessive sneezing, coughing, stomach pain, diarrhea, or light headedness. These symptoms may occur within 15-20 minutes after injection and may require medication.   Who Should Administer Allergy Shots?  The preferred location for receiving shots is your prescribing allergists office. Injections can sometimes be given at another facility where the physician and staff are trained to recognize and treat reactions, and have received instructions by your prescribing allergist.  What if I am late for an injection?   Injection dose will be adjusted depending upon how many days or weeks you are late for your injection.   What if I am sick?   Please report any illness to the nurse before receiving injections. She may adjust your dose or postpone injections depending on your symptoms. If you have fever, flu, sinus infection or chest congestion it is best to postpone allergy injections until you are better. Never get an allergy injection if your asthma is causing you problems. If your symptoms persist, seek out medical care to get your health problem under control.  What If I am or Become Pregnant:  Women that become pregnant should schedule an appointment with [facility] before receiving any further allergy injections.

## 2024-07-26 ENCOUNTER — Other Ambulatory Visit: Payer: Self-pay | Admitting: Internal Medicine

## 2024-07-26 DIAGNOSIS — E039 Hypothyroidism, unspecified: Secondary | ICD-10-CM

## 2024-07-30 ENCOUNTER — Telehealth: Payer: Self-pay | Admitting: *Deleted

## 2024-07-30 DIAGNOSIS — E039 Hypothyroidism, unspecified: Secondary | ICD-10-CM

## 2024-07-30 MED ORDER — LEVOTHYROXINE SODIUM 125 MCG PO TABS: 125.0000 ug | ORAL_TABLET | Freq: Every day | ORAL | 0 refills | Status: DC

## 2024-07-30 NOTE — Telephone Encounter (Signed)
 Copied from CRM [id number]. Topic: Appointments - Appointment Scheduling >> Jul 30, 2024 11:55 AM Rea ORN wrote: Pt called to schedule an appt with PCP in order to get med refills. 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 I do not have any availability on Friday with PCP. Pt is asking can she see someone else.   Please call back,  [phone], and please leave a voicemail if she doesn't answer because she is at work.

## 2024-07-30 NOTE — Telephone Encounter (Signed)
 Spoke with the patient, informed her a visit is needed since her last appt was over 1 yr ago and PCP may order lab testing also.  Appt was scheduled on 1/19 and patient is aware a 30-day supply of Levothyroxine  was sent to [facility].

## 2024-08-05 ENCOUNTER — Other Ambulatory Visit: Payer: Self-pay

## 2024-08-05 ENCOUNTER — Encounter (HOSPITAL_BASED_OUTPATIENT_CLINIC_OR_DEPARTMENT_OTHER): Payer: Self-pay | Admitting: Otolaryngology

## 2024-08-12 ENCOUNTER — Ambulatory Visit (HOSPITAL_BASED_OUTPATIENT_CLINIC_OR_DEPARTMENT_OTHER)
Admission: RE | Admit: 2024-08-12 | Discharge: 2024-08-13 | Disposition: A | Attending: Otolaryngology | Admitting: Otolaryngology

## 2024-08-12 ENCOUNTER — Other Ambulatory Visit: Payer: Self-pay

## 2024-08-12 ENCOUNTER — Encounter (HOSPITAL_BASED_OUTPATIENT_CLINIC_OR_DEPARTMENT_OTHER): Admission: RE | Payer: Self-pay | Source: Home / Self Care

## 2024-08-12 ENCOUNTER — Ambulatory Visit (HOSPITAL_BASED_OUTPATIENT_CLINIC_OR_DEPARTMENT_OTHER): Admitting: Anesthesiology

## 2024-08-12 ENCOUNTER — Encounter (HOSPITAL_BASED_OUTPATIENT_CLINIC_OR_DEPARTMENT_OTHER): Payer: Self-pay | Admitting: Otolaryngology

## 2024-08-12 DIAGNOSIS — J320 Chronic maxillary sinusitis: Secondary | ICD-10-CM | POA: Diagnosis present

## 2024-08-12 DIAGNOSIS — J343 Hypertrophy of nasal turbinates: Secondary | ICD-10-CM | POA: Insufficient documentation

## 2024-08-12 DIAGNOSIS — I10 Essential (primary) hypertension: Secondary | ICD-10-CM | POA: Diagnosis not present

## 2024-08-12 DIAGNOSIS — J342 Deviated nasal septum: Secondary | ICD-10-CM | POA: Diagnosis not present

## 2024-08-12 DIAGNOSIS — J310 Chronic rhinitis: Secondary | ICD-10-CM | POA: Diagnosis not present

## 2024-08-12 DIAGNOSIS — J338 Other polyp of sinus: Secondary | ICD-10-CM | POA: Diagnosis not present

## 2024-08-12 DIAGNOSIS — Z9889 Other specified postprocedural states: Secondary | ICD-10-CM

## 2024-08-12 HISTORY — PX: SINUS ENDO WITH FUSION: SHX5329

## 2024-08-12 SURGERY — SURGERY, PARANASAL SINUS, ENDOSCOPIC, WITH NASAL SEPTOPLASTY, TURBINOPLASTY, AND MAXILLARY SINUSOTOMY
Anesthesia: General | Site: Nose | Laterality: Bilateral

## 2024-08-12 MED ORDER — ACETAMINOPHEN 500 MG PO TABS
1000.0000 mg | ORAL_TABLET | Freq: Once | ORAL | Status: AC
  Administered 2024-08-12: 1000 mg via ORAL

## 2024-08-12 MED ORDER — PHENYLEPHRINE HCL (PRESSORS) 10 MG/ML IV SOLN
INTRAVENOUS | Status: DC | PRN
  Administered 2024-08-12: 80 ug via INTRAVENOUS

## 2024-08-12 MED ORDER — BUPIVACAINE HCL (PF) 0.25 % IJ SOLN
INTRAMUSCULAR | Status: AC
  Filled 2024-08-12: qty 90

## 2024-08-12 MED ORDER — ROCURONIUM BROMIDE 100 MG/10ML IV SOLN
INTRAVENOUS | Status: DC | PRN
  Administered 2024-08-12: 50 mg via INTRAVENOUS

## 2024-08-12 MED ORDER — CEFAZOLIN SODIUM 1 G IJ SOLR
INTRAMUSCULAR | Status: AC
  Filled 2024-08-12: qty 20

## 2024-08-12 MED ORDER — LIDOCAINE HCL (CARDIAC) PF 100 MG/5ML IV SOSY
PREFILLED_SYRINGE | INTRAVENOUS | Status: DC | PRN
  Administered 2024-08-12: 80 mg via INTRAVENOUS

## 2024-08-12 MED ORDER — LIDOCAINE-EPINEPHRINE 1 %-1:100000 IJ SOLN
INTRAMUSCULAR | Status: AC
  Filled 2024-08-12: qty 1

## 2024-08-12 MED ORDER — DEXMEDETOMIDINE HCL IN NACL 80 MCG/20ML IV SOLN
INTRAVENOUS | Status: AC
  Filled 2024-08-12: qty 20

## 2024-08-12 MED ORDER — DEXAMETHASONE SODIUM PHOSPHATE 4 MG/ML IJ SOLN
INTRAMUSCULAR | Status: DC | PRN
  Administered 2024-08-12: 5 mg via INTRAVENOUS

## 2024-08-12 MED ORDER — MIDAZOLAM HCL (PF) 2 MG/2ML IJ SOLN
INTRAMUSCULAR | Status: DC | PRN
  Administered 2024-08-12: 2 mg via INTRAVENOUS

## 2024-08-12 MED ORDER — ACETAMINOPHEN 500 MG PO TABS
ORAL_TABLET | ORAL | Status: AC
  Filled 2024-08-12: qty 2

## 2024-08-12 MED ORDER — ONDANSETRON HCL 4 MG/2ML IJ SOLN: 4.0000 mg | INTRAMUSCULAR | Status: DC | PRN

## 2024-08-12 MED ORDER — LEVOTHYROXINE SODIUM 125 MCG PO TABS
125.0000 ug | ORAL_TABLET | Freq: Every day | ORAL | Status: DC
  Filled 2024-08-12: qty 1

## 2024-08-12 MED ORDER — PROPOFOL 10 MG/ML IV BOLUS
INTRAVENOUS | Status: DC | PRN
  Administered 2024-08-12: 120 mg via INTRAVENOUS

## 2024-08-12 MED ORDER — ONDANSETRON HCL 4 MG PO TABS: 4.0000 mg | ORAL_TABLET | ORAL | Status: DC | PRN

## 2024-08-12 MED ORDER — METHOCARBAMOL 500 MG PO TABS
500.0000 mg | ORAL_TABLET | Freq: Two times a day (BID) | ORAL | Status: DC
  Administered 2024-08-12: 500 mg via ORAL
  Filled 2024-08-12: qty 1

## 2024-08-12 MED ORDER — FENTANYL CITRATE (PF) 100 MCG/2ML IJ SOLN
INTRAMUSCULAR | Status: DC | PRN
  Administered 2024-08-12: 25 ug via INTRAVENOUS
  Administered 2024-08-12: 50 ug via INTRAVENOUS
  Administered 2024-08-12: 25 ug via INTRAVENOUS

## 2024-08-12 MED ORDER — KCL IN DEXTROSE-NACL 20-5-0.45 MEQ/L-%-% IV SOLN: INTRAVENOUS | Status: DC

## 2024-08-12 MED ORDER — LIDOCAINE-EPINEPHRINE 1 %-1:100000 IJ SOLN
INTRAMUSCULAR | Status: DC | PRN
  Administered 2024-08-12: 4 mL

## 2024-08-12 MED ORDER — ACETAMINOPHEN 325 MG PO TABS: 650.0000 mg | ORAL_TABLET | Freq: Four times a day (QID) | ORAL | Status: DC | PRN

## 2024-08-12 MED ORDER — MUPIROCIN 2 % EX OINT
TOPICAL_OINTMENT | CUTANEOUS | Status: AC
  Filled 2024-08-12: qty 22

## 2024-08-12 MED ORDER — MUPIROCIN 2 % EX OINT
TOPICAL_OINTMENT | CUTANEOUS | Status: DC | PRN
  Administered 2024-08-12: 1 via TOPICAL

## 2024-08-12 MED ORDER — TIZANIDINE HCL 4 MG PO TABS
4.0000 mg | ORAL_TABLET | Freq: Four times a day (QID) | ORAL | Status: DC | PRN
  Filled 2024-08-12: qty 1

## 2024-08-12 MED ORDER — FENTANYL CITRATE (PF) 100 MCG/2ML IJ SOLN
INTRAMUSCULAR | Status: AC
  Filled 2024-08-12: qty 2

## 2024-08-12 MED ORDER — OXYCODONE HCL 5 MG PO TABS: 5.0000 mg | ORAL_TABLET | Freq: Once | ORAL | Status: DC | PRN

## 2024-08-12 MED ORDER — MIDAZOLAM HCL 2 MG/2ML IJ SOLN
INTRAMUSCULAR | Status: AC
  Filled 2024-08-12: qty 2

## 2024-08-12 MED ORDER — OXYMETAZOLINE HCL 0.05 % NA SOLN
NASAL | Status: DC | PRN
  Administered 2024-08-12: 1 via TOPICAL

## 2024-08-12 MED ORDER — ONDANSETRON HCL 4 MG/2ML IJ SOLN
INTRAMUSCULAR | Status: DC | PRN
  Administered 2024-08-12: 4 mg via INTRAVENOUS

## 2024-08-12 MED ORDER — LACTATED RINGERS IV SOLN: INTRAVENOUS | Status: DC

## 2024-08-12 MED ORDER — FENTANYL CITRATE (PF) 100 MCG/2ML IJ SOLN: 25.0000 ug | INTRAMUSCULAR | Status: DC | PRN

## 2024-08-12 MED ORDER — OXYCODONE HCL 5 MG/5ML PO SOLN: 5.0000 mg | Freq: Once | ORAL | Status: DC | PRN

## 2024-08-12 MED ORDER — AMISULPRIDE (ANTIEMETIC) 5 MG/2ML IV SOLN: 10.0000 mg | Freq: Once | INTRAVENOUS | Status: DC | PRN

## 2024-08-12 MED ORDER — CEFAZOLIN SODIUM-DEXTROSE 2-3 GM-%(50ML) IV SOLR
INTRAVENOUS | Status: DC | PRN
  Administered 2024-08-12: 2 g via INTRAVENOUS

## 2024-08-12 MED ORDER — OXYCODONE-ACETAMINOPHEN 5-325 MG PO TABS
1.0000 | ORAL_TABLET | ORAL | Status: DC | PRN
  Administered 2024-08-12 – 2024-08-13 (×2): 1 via ORAL
  Filled 2024-08-12: qty 1
  Filled 2024-08-12: qty 2
  Filled 2024-08-12: qty 1

## 2024-08-12 MED ORDER — SUGAMMADEX SODIUM 200 MG/2ML IV SOLN
INTRAVENOUS | Status: DC | PRN
  Administered 2024-08-12: 140 mg via INTRAVENOUS

## 2024-08-12 MED ORDER — OXYMETAZOLINE HCL 0.05 % NA SOLN
NASAL | Status: AC
  Filled 2024-08-12: qty 30

## 2024-08-12 SURGICAL SUPPLY — 37 items
BLADE ROTATE RAD 12 4 M4 (BLADE) IMPLANT
BLADE ROTATE RAD 40 4 M4 (BLADE) IMPLANT
BLADE TRICUT ROTATE M4 4 5PK (BLADE) ×1 IMPLANT
CANISTER SUC SOCK COL 7IN (MISCELLANEOUS) ×2 IMPLANT
CANISTER SUCT 1200ML W/VALVE (MISCELLANEOUS) ×2 IMPLANT
COAGULATOR SUCT 8FR VV (MISCELLANEOUS) ×1 IMPLANT
DEFOGGER MIRROR 1QT (MISCELLANEOUS) ×1 IMPLANT
DRSG NASOPORE 8CM (GAUZE/BANDAGES/DRESSINGS) IMPLANT
ELECTRODE REM PT RTRN 9FT ADLT (ELECTROSURGICAL) ×1 IMPLANT
GAUZE SPONGE 2X2 STRL 8-PLY (GAUZE/BANDAGES/DRESSINGS) ×1 IMPLANT
GLOVE BIO SURGEON STRL SZ7.5 (GLOVE) ×1 IMPLANT
GLOVE BIOGEL PI IND STRL 7.0 (GLOVE) IMPLANT
GLOVE SURG SS PI 6.5 STRL IVOR (GLOVE) IMPLANT
GLOVE SURG SS PI 7.0 STRL IVOR (GLOVE) IMPLANT
GOWN STRL REUS W/ TWL LRG LVL3 (GOWN DISPOSABLE) ×2 IMPLANT
HEMOSTAT SURGICEL 2X14 (HEMOSTASIS) IMPLANT
IV 0.9% NACL 250 ML (IV SOLUTION) IMPLANT
IV NS 500ML BAXH (IV SOLUTION) ×1 IMPLANT
NEEDLE HYPO 25X1 1.5 SAFETY (NEEDLE) IMPLANT
NEEDLE SPNL 25GX3.5 QUINCKE BL (NEEDLE) IMPLANT
PACK BASIN DAY SURGERY FS (CUSTOM PROCEDURE TRAY) ×1 IMPLANT
PACK ENT DAY SURGERY (CUSTOM PROCEDURE TRAY) ×1 IMPLANT
SLEEVE SCD COMPRESS KNEE MED (STOCKING) ×1 IMPLANT
SOLN 0.9% NACL POUR BTL 1000ML (IV SOLUTION) ×1 IMPLANT
SPIKE FLUID TRANSFER (MISCELLANEOUS) IMPLANT
SPLINT NASAL AIRWAY SILICONE (MISCELLANEOUS) ×1 IMPLANT
SPONGE NEURO XRAY DETECT 1X3 (DISPOSABLE) ×1 IMPLANT
SUCTION TUBE FRAZIER 10FR DISP (SUCTIONS) IMPLANT
SUT CHROMIC 3 0 PS 2 (SUTURE) ×1 IMPLANT
SUT PLAIN 4 0 ~~LOC~~ 1 (SUTURE) ×1 IMPLANT
SUT PROLENE 3 0 PS 2 (SUTURE) ×1 IMPLANT
SUT VIC AB 4-0 P-3 18XBRD (SUTURE) IMPLANT
SYR 50ML LL SCALE MARK (SYRINGE) ×1 IMPLANT
TOWEL GREEN STERILE FF (TOWEL DISPOSABLE) ×1 IMPLANT
TUBE CONNECTING 20X1/4 (TUBING) ×1 IMPLANT
TUBE SALEM SUMP 16F (TUBING) ×1 IMPLANT
YANKAUER SUCT BULB TIP NO VENT (SUCTIONS) ×1 IMPLANT

## 2024-08-12 NOTE — Discharge Instructions (Addendum)
 " Post Anesthesia Home Care Instructions  Activity: Get plenty of rest for the remainder of the day. A responsible individual must stay with you for 24 hours following the procedure.  For the next 24 hours, DO NOT: -Drive a car -Advertising copywriter -Drink alcoholic beverages -Take any medication unless instructed by your physician -Make any legal decisions or sign important papers.  Meals: Start with liquid foods such as gelatin or soup. Progress to regular foods as tolerated. Avoid greasy, spicy, heavy foods. If nausea and/or vomiting occur, drink only clear liquids until the nausea and/or vomiting subsides. Call your physician if vomiting continues.  Special Instructions/Symptoms: Your throat may feel dry or sore from the anesthesia or the breathing tube placed in your throat during surgery. If this causes discomfort, gargle with warm salt water. The discomfort should disappear within 24 hours.  If you had a scopolamine patch placed behind your ear for the management of post- operative nausea and/or vomiting:  1. The medication in the patch is effective for 72 hours, after which it should be removed.  Wrap patch in a tissue and discard in the trash. Wash hands thoroughly with soap and water. 2. You may remove the patch earlier than 72 hours if you experience unpleasant side effects which may include dry mouth, dizziness or visual disturbances. 3. Avoid touching the patch. Wash your hands with soap and water after contact with the patch.    -----------------  POSTOPERATIVE INSTRUCTIONS FOR PATIENTS HAVING NASAL OR SINUS OPERATIONS ACTIVITY: Restrict activity at home for the first two days, resting as much as possible. Light activity is best. You may usually return to work within a week. You should refrain from nose blowing, strenuous activity, or heavy lifting greater than 20lbs for a total of one week after your operation.  If sneezing cannot be avoided, sneeze with your mouth  open. DISCOMFORT: You may experience a dull headache and pressure along with nasal congestion and discharge. These symptoms may be worse during the first week after the operation but may last as long as two to four weeks.  Please take Tylenol  or the pain medication that has been prescribed for you. Do not take aspirin  or aspirin  containing medications since they may cause bleeding.  You may experience symptoms of post nasal drainage, nasal congestion, headaches and fatigue for two or three months after your operation.  BLEEDING: You may have some blood tinged nasal drainage for approximately two weeks after the operation.  The discharge will be worse for the first week.  Please call our office at [phone] or go to the nearest hospital emergency room if you experience any of the following: heavy, bright red blood from your nose or mouth that lasts longer than 15 minutes or coughing up or vomiting bright red blood or blood clots. GENERAL CONSIDERATIONS: A gauze dressing will be placed on your upper lip to absorb any drainage after the operation. You may need to change this several times a day.  If you do not have very much drainage, you may remove the dressing.  Remember that you may gently wipe your nose with a tissue and sniff in, but DO NOT blow your nose. Please keep all of your postoperative appointments.  Your final results after the operation will depend on proper follow-up.  The initial visit is usually 2 to 5 days after the operation.  During this visit, the remaining nasal packing and internal septal splints will be removed.  Your nasal and sinus cavities will be  cleaned.  During the second visit, your nasal and sinus cavities will be cleaned again. Have someone drive you to your first two postoperative appointments.  How you care for your nose after the operation will influence the results that you obtain.  You should follow all directions, take your medication as prescribed, and call our office  [phone] with any problems or questions. You may be more comfortable sleeping with your head elevated on two pillows. Do not take any medications that we have not prescribed or recommended. WARNING SIGNS: if any of the following should occur, please call our office: Persistent fever greater than 102F. Persistent vomiting. Severe and constant pain that is not relieved by prescribed pain medication. Trauma to the nose. Rash or unusual side effects from any medicines.    --------------------------------------------------   [facility] [address] [place], [place]  [address] Phone:  [phone]   Patient: Nichole Beck  Date of Birth: [birth year]  Date of Visit: 08/12/2024   To Whom It May Concern:  Nichole Beck underwent nasal and sinus surgery on August 12, 2024.  Please excuse her from work for 1 week while she recovers from her surgery.  She may return to work without any restriction on August 20, 2024.    Sincerely,   Elisabel Hanover Dois Moccasin, MD [facility] "

## 2024-08-12 NOTE — Anesthesia Postprocedure Evaluation (Signed)
"   Anesthesia Post Note  Patient: Nichole Beck  Procedure(s) Performed: SEPTOPLASTY, BILATERAL INFERIOR TURBINATE REDUCTION, LEFT MAXILLARY ANTROSTOMY WITH POLYP REMOVAL (Bilateral: Nose)     Patient location during evaluation: PACU Anesthesia Type: General Level of consciousness: awake and alert Pain management: pain level controlled Vital Signs Assessment: post-procedure vital signs reviewed and stable Respiratory status: spontaneous breathing, nonlabored ventilation, respiratory function stable and patient connected to nasal cannula oxygen Cardiovascular status: blood pressure returned to baseline and stable Postop Assessment: no apparent nausea or vomiting Anesthetic complications: no   No notable events documented.  Last Vitals:  Vitals:   08/12/24 1030 08/12/24 1100  BP: (!) 147/87 (!) 145/97  Pulse: 79 77  Resp: 14 16  Temp:  (!) 36.3 C  SpO2: 94% 99%    Last Pain:  Vitals:   08/12/24 1100  PainSc: 0-No pain                 Mikena Masoner L Angelica Wix      "

## 2024-08-12 NOTE — Anesthesia Preprocedure Evaluation (Signed)
"                                    Anesthesia Evaluation  Patient identified by MRN, date of birth, ID band Patient awake    Reviewed: Allergy & Precautions, NPO status , Patient's Chart, lab work & pertinent test results  History of Anesthesia Complications Negative for: history of anesthetic complications  Airway Mallampati: II  TM Distance: >3 FB Neck ROM: Full    Dental no notable dental hx. (+) Teeth Intact   Pulmonary neg sleep apnea, neg COPD, Current SmokerPatient did not abstain from smoking.   Pulmonary exam normal breath sounds clear to auscultation       Cardiovascular Exercise Tolerance: Good METS(-) hypertension(-) CAD and (-) Past MI negative cardio ROS (-) dysrhythmias  Rhythm:Regular Rate:Normal - Systolic murmurs    Neuro/Psych negative neurological ROS  negative psych ROS   GI/Hepatic ,neg GERD  ,,(+)     (-) substance abuse    Endo/Other  neg diabetesHypothyroidism    Renal/GU negative Renal ROS     Musculoskeletal   Abdominal   Peds  Hematology   Anesthesia Other Findings Past Medical History: No date: Abnormal Pap smear of cervix     Comment:  many yrs ago No date: Anemia No date: Hypothyroidism No date: Nicotine dependence No date: STD (sexually transmitted disease)     Comment:  gonorrhea treated No date: Urticaria  Reproductive/Obstetrics                              Anesthesia Physical Anesthesia Plan  ASA: 2  Anesthesia Plan: General   Post-op Pain Management: Tylenol  PO (pre-op)*   Induction: Intravenous  PONV Risk Score and Plan: 3 and Ondansetron  and Dexamethasone   Airway Management Planned: Oral ETT  Additional Equipment: None  Intra-op Plan:   Post-operative Plan: Extubation in OR  Informed Consent: I have reviewed the patients History and Physical, chart, labs and discussed the procedure including the risks, benefits and alternatives for the proposed anesthesia with  the patient or authorized representative who has indicated his/her understanding and acceptance.     Dental advisory given  Plan Discussed with: CRNA and Surgeon  Anesthesia Plan Comments: (Discussed risks of anesthesia with patient, including PONV, sore throat, lip/dental/eye damage. Rare risks discussed as well, such as cardiorespiratory and neurological sequelae, and allergic reactions. Discussed the role of CRNA in patient's perioperative care. Patient understands. Patient counseled on benefits of smoking cessation, and increased perioperative risks associated with continued smoking. )        Anesthesia Quick Evaluation  "

## 2024-08-12 NOTE — Anesthesia Procedure Notes (Signed)
 Procedure Name: Intubation Date/Time: 08/12/2024 8:37 AM  Performed by: Donnell Berwyn SQUIBB, CRNAPre-anesthesia Checklist: Patient identified, Emergency Drugs available, Suction available, Patient being monitored and Timeout performed Patient Re-evaluated:Patient Re-evaluated prior to induction Oxygen Delivery Method: Circle system utilized Preoxygenation: Pre-oxygenation with 100% oxygen Induction Type: IV induction Ventilation: Mask ventilation without difficulty Laryngoscope Size: Mac and 3 Grade View: Grade I Tube type: Oral Tube size: 7.0 mm Number of attempts: 1 Airway Equipment and Method: Stylet Placement Confirmation: positive ETCO2, breath sounds checked- equal and bilateral and ETT inserted through vocal cords under direct vision Secured at: 20 cm Tube secured with: Tape Dental Injury: Teeth and Oropharynx as per pre-operative assessment

## 2024-08-12 NOTE — H&P (Signed)
 Cc: Chronic nasal obstruction, left maxillary cyst/polyp   HPI: The patient is a 54 year old female who returns today for her follow-up evaluation.  The patient was last seen in September 2025.  At that time, she was complaining of chronic nasal obstruction for the past 2 years.  She was treated with multiple allergy medications and steroid nasal sprays.  Her CT scan showed a 1.5 cm left maxillary cyst/polyp.  The patient was treated with Flonase  nasal spray and nasal saline irrigation.  The patient returns today complaining of persistent nasal obstruction and facial pressure.  She has not responded to medical treatment.  She is interested in more definitive therapy.   Exam: General: Communicates without difficulty, well nourished, no acute distress. Head: Normocephalic, no evidence injury, no tenderness, facial buttresses intact without stepoff. Face/sinus: No tenderness to palpation and percussion. Facial movement is normal and symmetric. Eyes: PERRL, EOMI. No scleral icterus, conjunctivae clear. Neuro: CN II exam reveals vision grossly intact.  No nystagmus at any point of gaze. Ears: Auricles well formed without lesions.  Ear canals are intact without mass or lesion.  No erythema or edema is appreciated.  The TMs are intact without fluid. Nose: External evaluation reveals normal support and skin without lesions.  Dorsum is intact.  Anterior rhinoscopy reveals congested mucosa over anterior aspect of inferior turbinates and deviated septum.  No purulence noted. Oral:  Oral cavity and oropharynx are intact, symmetric, without erythema or edema.  Mucosa is moist without lesions. Neck: Full range of motion without pain.  There is no significant lymphadenopathy.  No masses palpable.  Thyroid  bed within normal limits to palpation.  Parotid glands and submandibular glands equal bilaterally without mass.  Trachea is midline. Neuro:  CN 2-12 grossly intact.    Assessment: 1.  Chronic rhinitis with nasal mucosal  congestion, nasal septal deviation, and bilateral inferior turbinate hypertrophy.  More than 95% of her nasal passageways are obstructed bilaterally.  She has not responded to medical treatment for the past 2 years. 2.  The patient also has a 1.5 cm left maxillary cyst/polyp.   Plan: 1.  The physical exam findings are reviewed with the patient. 2.  Continue with Flonase  nasal spray and nasal saline irrigation daily. 3.  In light of her persistent symptoms, she may benefit from surgical intervention with septoplasty, bilateral turbinate reduction, and left maxillary antrostomy with polyp removal.  The risk, benefits, alternatives, and details of the procedures are extensively discussed with the patient.  Questions were invited and answered. 4.  The patient would like to proceed with the procedures.

## 2024-08-12 NOTE — Transfer of Care (Signed)
 Immediate Anesthesia Transfer of Care Note  Patient: Nichole Beck  Procedure(s) Performed: SEPTOPLASTY, BILATERAL INFERIOR TURBINATE REDUCTION, LEFT MAXILLARY ANTROSTOMY WITH POLYP REMOVAL (Bilateral: Nose)  Patient Location: PACU  Anesthesia Type:General  Level of Consciousness: drowsy and patient cooperative  Airway & Oxygen Therapy: Patient Spontanous Breathing and Patient connected to face mask oxygen  Post-op Assessment: Report given to RN and Post -op Vital signs reviewed and stable  Post vital signs: Reviewed and stable  Last Vitals:  Vitals Value Taken Time  BP    Temp    Pulse 88 08/12/24 10:10  Resp 13 08/12/24 10:10  SpO2 98 % 08/12/24 10:10  Vitals shown include unfiled device data.  Last Pain:  Vitals:   08/12/24 0715  PainSc: 0-No pain         Complications: No notable events documented.

## 2024-08-12 NOTE — Op Note (Signed)
 DATE OF PROCEDURE: 08/12/2024  OPERATIVE REPORT   SURGEON: Daniel Moccasin, MD   PREOPERATIVE DIAGNOSES:  1. Severe nasal septal deviation.  2. Bilateral inferior turbinate hypertrophy.  3. Chronic nasal obstruction. 4. Chronic left maxillary sinusitis and polyposis.  POSTOPERATIVE DIAGNOSES:  1. Severe nasal septal deviation.  2. Bilateral inferior turbinate hypertrophy.  3. Chronic nasal obstruction. 4. Chronic left maxillary sinusitis and polyposis.  PROCEDURE PERFORMED:  1. Septoplasty.  2. Bilateral partial inferior turbinate resection.  3. Endoscopic left maxillary antrostomy with polyp removal.  ANESTHESIA: General endotracheal tube anesthesia.   COMPLICATIONS: None.   ESTIMATED BLOOD LOSS: 150 mL.   INDICATION FOR PROCEDURE: Nichole Beck is a 54 y.o. female with a history of chronic nasal obstruction for the past 2 years. She was treated with multiple allergy medications and steroid nasal sprays. Her CT scan showed a 1.5 cm left maxillary cyst/polyp, in addition to nasal septal deviation and bilateral inferior turbinate hypertrophy. The patient was treated with Flonase  nasal spray and nasal saline irrigation. The patient returns today complaining of persistent nasal obstruction and facial pressure. She has not responded to medical treatment. Based on the above findings, the decision was made for the patient to undergo the above-stated procedures. The risks, benefits, alternatives, and details of the procedures were discussed with the patient. Questions were invited and answered. Informed consent was obtained.   DESCRIPTION OF PROCEDURE: The patient was taken to the operating room and placed supine on the operating table. General endotracheal tube anesthesia was administered by the anesthesiologist. The patient was positioned, and prepped and draped in the standard fashion for nasal surgery. Pledgets soaked with Afrin were placed in both nasal cavities for decongestion. The pledgets were  subsequently removed.   Examination of the nasal cavity revealed a nasal septal deviation. 1% lidocaine  with 1:100,000 epinephrine  was injected onto the nasal septum bilaterally. A hemitransfixion incision was made on the left side. The mucosal flap was carefully elevated on the left side. A cartilaginous incision was made 1 cm superior to the caudal margin of the nasal septum. Mucosal flap was also elevated on the right side in the similar fashion. It should be noted that due to the severe septal deviation, the deviated portion of the cartilaginous and bony septum had to be removed in piecemeal fashion. Once the deviated portions were removed, a straight midline septum was achieved. The septum was then quilted with 4-0 plain gut sutures. The hemitransfixion incision was closed with interrupted 4-0 chromic sutures.   The inferior one half of both hypertrophied inferior turbinate was crossclamped with a Kelly clamp. The inferior one half of each inferior turbinate was then resected with a pair of cross cutting scissors. Hemostasis was achieved with a suction cautery device.   Using a 0 endoscope, the left nasal cavity was examined.  The left middle turbinate was medialized with a Therapist, nutritional.  Polypoid tissue was noted within the middle meatus. The polypoid tissue was removed using a combination of microdebrider and Blakesley forceps. The uncinate process was resected with a freer elevator. The maxillary antrum was entered and enlarged using a combination of backbiter and microdebrider. Polypoid tissue was removed from the left maxillary sinus.  The left maxillary sinus was copiously irrigated with saline solution. Doyle splints were applied to the nasal septum.  The care of the patient was turned over to the anesthesiologist. The patient was awakened from anesthesia without difficulty. The patient was extubated and transferred to the recovery room in good condition.  OPERATIVE FINDINGS: Nasal septal  deviation and bilateral inferior turbinate hypertrophy.  Chronic left maxillary sinusitis and polyposis.  SPECIMEN: Left maxillary sinus content.  FOLLOWUP CARE: The patient will be observed overnight.  She will be discharged home in the morning.   The patient will follow up in my office in 3 days for splint removal.   Micheale Schlack Dois Moccasin, MD

## 2024-08-13 ENCOUNTER — Encounter (HOSPITAL_BASED_OUTPATIENT_CLINIC_OR_DEPARTMENT_OTHER): Payer: Self-pay | Admitting: Otolaryngology

## 2024-08-13 DIAGNOSIS — J343 Hypertrophy of nasal turbinates: Secondary | ICD-10-CM | POA: Diagnosis not present

## 2024-08-14 LAB — SURGICAL PATHOLOGY

## 2024-08-15 ENCOUNTER — Encounter (INDEPENDENT_AMBULATORY_CARE_PROVIDER_SITE_OTHER): Payer: Self-pay | Admitting: Otolaryngology

## 2024-08-15 ENCOUNTER — Ambulatory Visit (INDEPENDENT_AMBULATORY_CARE_PROVIDER_SITE_OTHER): Admitting: Otolaryngology

## 2024-08-15 VITALS — BP 124/84 | HR 78 | Ht 67.0 in | Wt 165.0 lb

## 2024-08-15 DIAGNOSIS — J320 Chronic maxillary sinusitis: Secondary | ICD-10-CM

## 2024-08-15 DIAGNOSIS — J338 Other polyp of sinus: Secondary | ICD-10-CM | POA: Diagnosis not present

## 2024-08-15 NOTE — Progress Notes (Signed)
 Patient ID: Nichole Beck, female   DOB: [birth year], 54 y.o.   MRN: [id number]  Procedure: Left nasal/sinus debridement status post endoscopic sinus surgery.  Indication: The patient previously underwent left endoscopic sinus surgery to treat her chronic left maxillary sinusitis and polyposis.  The patient returns today complaining of significant nasal congestion and crusting.  She had a moderate amount of bleeding from the nasal cavities.  Currently the patient denies any facial pain, fever, or visual change.  Anesthesia: Topical Xylocaine  and Neo-Synephrine.  Description: The patient is placed upright in the exam chair. Both nasal cavities are sprayed with topical Xylocaine  and Neo-Synephrine.  A 0 rigid endoscope is used for the examination. The scope is advanced past the left nostril into the left nasal cavity. A significant amount of crusting and blood clots are noted within the left nasal cavity and the left maxillary cavity.  The crusting and blood clots are removed with suction catheters and alligator forceps, which are inserted in parallel with the rigid endoscope.  After the debridement procedure, the maxillary antrum is noted to be patent. The patient tolerated the procedure well.  Follow up care: The patient is instructed to perform daily nasal saline irrigation.  The patient will return for re-evaluation in approximately 3 weeks.

## 2024-08-19 ENCOUNTER — Encounter: Payer: Self-pay | Admitting: Internal Medicine

## 2024-08-19 ENCOUNTER — Ambulatory Visit: Admitting: Internal Medicine

## 2024-08-19 VITALS — BP 130/80 | HR 95 | Temp 98.2°F | Wt 164.7 lb

## 2024-08-19 DIAGNOSIS — E039 Hypothyroidism, unspecified: Secondary | ICD-10-CM

## 2024-08-19 LAB — TSH: TSH: 0.9 u[IU]/mL (ref 0.35–5.50)

## 2024-08-19 NOTE — Progress Notes (Signed)
 "    Established Patient Office Visit     CC/Reason for Visit: Hypothyroidism follow-up  HPI: Nichole Beck is a 54 y.o. female who is coming in today for the above mentioned reasons. Past Medical History is significant for: Hypothyroidism who has not been seen for over a year.  She is in need of levothyroxine  refills.  She is recovering from sinus surgery.   Past Medical/Surgical History: Past Medical History:  Diagnosis Date   Abnormal Pap smear of cervix    many yrs ago   Anemia    Hypothyroidism    Nicotine dependence    STD (sexually transmitted disease)    gonorrhea treated   Urticaria     Past Surgical History:  Procedure Laterality Date   BREAST BIOPSY     COLPOSCOPY     SINUS ENDO WITH FUSION Bilateral 08/12/2024   Procedure: SEPTOPLASTY, BILATERAL INFERIOR TURBINATE REDUCTION, LEFT MAXILLARY ANTROSTOMY WITH POLYP REMOVAL;  Surgeon: Karis Clunes, MD;  Location: [facility];  Service: ENT;  Laterality: Bilateral;  Septoplasty, bilateral turbinate reduction, left maxillary antrostomy with polyp removal   TUBAL LIGATION      Social History:  reports that she has been smoking cigarettes. She has a 16.5 pack-year smoking history. She has never been exposed to tobacco smoke. She has never used smokeless tobacco. She reports current alcohol use. She reports that she does not use drugs.  Allergies: Allergies[1]  Family History:  Family History  Problem Relation Age of Onset   Cancer Maternal Grandmother    Diabetes Maternal Grandmother    Cancer Paternal Grandmother    Diabetes Paternal Grandmother    Asthma Daughter    Breast cancer Neg Hx    Colon polyps Neg Hx    Colon cancer Neg Hx    Esophageal cancer Neg Hx    Rectal cancer Neg Hx    Stomach cancer Neg Hx     Current Medications[2]  Review of Systems:  Negative unless indicated in HPI.   Physical Exam: Vitals:   08/19/24 1421  BP: 130/80  Pulse: 95  Temp: 98.2 F (36.8 C)  TempSrc:  Oral  SpO2: 98%  Weight: 164 lb 11.2 oz (74.7 kg)    Body mass index is 25.8 kg/m.   Physical Exam Vitals reviewed.  Constitutional:      Appearance: Normal appearance.  HENT:     Head: Normocephalic and atraumatic.  Musculoskeletal:     Cervical back: Neck supple. No tenderness.  Lymphadenopathy:     Cervical: No cervical adenopathy.  Skin:    General: Skin is warm and dry.  Neurological:     General: No focal deficit present.     Mental Status: She is alert and oriented to person, place, and time.  Psychiatric:        Mood and Affect: Mood normal.        Behavior: Behavior normal.        Thought Content: Thought content normal.        Judgment: Judgment normal.      Impression and Plan:  Hypothyroidism, unspecified type -     TSH; Future  - Check TSH, pending results will send levothyroxine  refills.   Time spent:22 minutes reviewing chart, interviewing and examining patient and formulating plan of care.     Tully Theophilus Andrews, MD [facility]     [1] No Known Allergies [2]  Current Outpatient Medications:    betamethasone  valerate ointment (VALISONE ) 0.1 %,  APPLY  OINTMENT TO AFFECTED AREA TWICE DAILY AS NEEDED, Disp: 30 g, Rfl: 0   clobetasol  cream (TEMOVATE ) 0.05 %, Apply 1 Application topically 2 (two) times daily., Disp: 45 g, Rfl: 1   ipratropium (ATROVENT ) 0.03 % nasal spray, Place 2 sprays into both nostrils 3 (three) times daily., Disp: 30 mL, Rfl: 5   levothyroxine  (SYNTHROID ) 125 MCG tablet, Take 1 tablet (125 mcg total) by mouth daily., Disp: 30 tablet, Rfl: 0   tacrolimus  (PROTOPIC ) 0.1 % ointment, Apply topically to areas of rough eczema 2 times a day., Disp: 100 g, Rfl: 2   triamcinolone  ointment (KENALOG ) 0.1 %, Apply 1 Application topically 2 (two) times daily. Use for one to two weeks max on the affected areas on your upper back and arms., Disp: 80 g, Rfl: 1   fluticasone  (FLONASE ) 50 MCG/ACT nasal spray, Place 2  sprays into both nostrils daily. (Patient not taking: Reported on 08/19/2024), Disp: 16 g, Rfl: 10   methocarbamol  (ROBAXIN ) 500 MG tablet, Take 1 tablet (500 mg total) by mouth 2 (two) times daily. (Patient not taking: Reported on 08/19/2024), Disp: 20 tablet, Rfl: 0   tiZANidine  (ZANAFLEX ) 4 MG tablet, Take 1 tablet (4 mg total) by mouth every 6 (six) hours as needed for muscle spasms. (Patient not taking: Reported on 08/19/2024), Disp: 10 tablet, Rfl: 0   Vitamin D , Ergocalciferol , (DRISDOL ) 1.25 MG (50000 UNIT) CAPS capsule, Take 50,000 Units by mouth once a week. (Patient not taking: Reported on 08/19/2024), Disp: , Rfl:   Current Facility-Administered Medications:    0.9 %  sodium chloride  infusion, 500 mL, Intravenous, Once, Stacia Glendia BRAVO, MD  "

## 2024-08-22 ENCOUNTER — Telehealth (INDEPENDENT_AMBULATORY_CARE_PROVIDER_SITE_OTHER): Payer: Self-pay

## 2024-08-22 ENCOUNTER — Ambulatory Visit: Payer: Self-pay | Admitting: Internal Medicine

## 2024-08-22 DIAGNOSIS — E039 Hypothyroidism, unspecified: Secondary | ICD-10-CM

## 2024-08-22 MED ORDER — LEVOTHYROXINE SODIUM 125 MCG PO TABS: 125.0000 ug | ORAL_TABLET | Freq: Every day | ORAL | 1 refills | Status: AC

## 2024-08-22 NOTE — Telephone Encounter (Signed)
 I spoke with patient when she called into office at 8:51am. Patient stated that she had surgery 08/12/24 by Dr. Karis. She is having bright red blood show when she blows her nose and blood clots when she blows her nose. I stated to her that the dark clots is most likely packing and old blood. Dr. Karis is not in office right now, I will send message to [facility] [place]. Patient understood.

## 2024-08-22 NOTE — Telephone Encounter (Signed)
 Returned patient's phone call and left her a voice mail, that having some bleeding is normal and blow her nose very gently. She has a post op appointment on 09/06/24.

## 2024-09-04 ENCOUNTER — Telehealth: Payer: Self-pay

## 2024-09-04 NOTE — Telephone Encounter (Signed)
 LVM regarding starting allergy  injections via [facility].   If patient calls back, please offer these dates 2/13,2/16,2/20 and 2/23 and see if time/date works for their availability.

## 2024-09-06 ENCOUNTER — Encounter (INDEPENDENT_AMBULATORY_CARE_PROVIDER_SITE_OTHER): Payer: Self-pay | Admitting: Otolaryngology

## 2024-09-06 ENCOUNTER — Ambulatory Visit (INDEPENDENT_AMBULATORY_CARE_PROVIDER_SITE_OTHER): Admitting: Otolaryngology

## 2024-09-06 VITALS — BP 114/78 | HR 90

## 2024-09-06 DIAGNOSIS — J338 Other polyp of sinus: Secondary | ICD-10-CM

## 2024-09-06 DIAGNOSIS — J320 Chronic maxillary sinusitis: Secondary | ICD-10-CM

## 2024-09-06 NOTE — Progress Notes (Signed)
 Patient ID: Nichole Beck, female   DOB: [birth year], 54 y.o.   MRN: [id number]  Procedure: Left nasal/sinus debridement status post endoscopic sinus surgery.  Indication: The patient previously underwent left endoscopic sinus surgery to treat her chronic left maxillary sinusitis and polyposis.  The patient returns today reporting improvement in her nasal congestion and crusting.  She had no significant bleeding from the nasal cavities.  Currently the patient denies any facial pain, fever, or visual change.  Anesthesia: Topical Xylocaine  and Neo-Synephrine.  Description: The patient is placed upright in the exam chair. Both nasal cavities are sprayed with topical Xylocaine  and Neo-Synephrine.  A 0 rigid endoscope is used for the examination. The scope is advanced past the left nostril into the left nasal cavity. A moderate amount of crusting is noted within the left nasal cavity and the left maxillary cavity.  The crusting is removed with suction catheters and alligator forceps, which are inserted in parallel with the rigid endoscope.  After the debridement procedure, the maxillary antrum is noted to be patent. The patient tolerated the procedure well.  Follow up care: The patient is instructed to continue daily nasal saline irrigation in 3 weeks.  The patient is not interested in returning for further sinus debridement due to the insurance co-pay.  The patient will return for re-evaluation in approximately 6 months.

## 2024-12-04 ENCOUNTER — Encounter: Admitting: Internal Medicine

## 2025-03-07 ENCOUNTER — Ambulatory Visit (INDEPENDENT_AMBULATORY_CARE_PROVIDER_SITE_OTHER): Admitting: Otolaryngology
